# Patient Record
Sex: Male | Born: 1995 | Race: White | Hispanic: No | Marital: Single | Smoking: Never smoker
Health system: Southern US, Community
[De-identification: ages and names within clinical notes are randomized; demographics above are authoritative.]

## PROBLEM LIST (undated history)

## (undated) DIAGNOSIS — R222 Localized swelling, mass and lump, trunk: Secondary | ICD-10-CM

## (undated) HISTORY — PX: NO PAST SURGERIES: SHX2092

---
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
## 2013-07-23 ENCOUNTER — Ambulatory Visit (INDEPENDENT_AMBULATORY_CARE_PROVIDER_SITE_OTHER): Payer: PRIVATE HEALTH INSURANCE | Admitting: Physician Assistant

## 2013-07-23 ENCOUNTER — Encounter: Payer: Self-pay | Admitting: Physician Assistant

## 2013-07-23 VITALS — BP 126/73 | HR 63 | Temp 98.6°F | Ht 71.0 in | Wt 171.0 lb

## 2013-07-23 DIAGNOSIS — L723 Sebaceous cyst: Secondary | ICD-10-CM

## 2013-07-23 NOTE — Progress Notes (Signed)
Subjective:     Patient ID: Devin Ware, male   DOB: Nov 07, 1996, 17 y.o.   MRN: [id number]  HPI Pt with a cyst to the midline L-spine States it has been there for several months Denies any pain or drainage from the site Area has been constant size Also sates he has an area intermit to the L cerv region  Review of Systems  All other systems reviewed and are negative.       Objective:   Physical Exam Small cystic lesion to the L spine just R lateral to spine No erythema, edema, or induration No area of fluct    Assessment:     Cyst    Plan:    Cont to observe If he wishes excision recommended referral to general surg given location F/U prn

## 2013-07-23 NOTE — Patient Instructions (Signed)

## 2013-08-27 ENCOUNTER — Telehealth: Payer: Self-pay | Admitting: Nurse Practitioner

## 2013-08-27 NOTE — Telephone Encounter (Signed)
DOT should be through the [facility] [place] [facility]. Scheduled on the industrial side of the schedule. [facility] nurse aware.  Patient aware of appt day and time.

## 2013-10-17 ENCOUNTER — Encounter (INDEPENDENT_AMBULATORY_CARE_PROVIDER_SITE_OTHER): Payer: Self-pay

## 2013-10-17 ENCOUNTER — Ambulatory Visit (INDEPENDENT_AMBULATORY_CARE_PROVIDER_SITE_OTHER): Payer: PRIVATE HEALTH INSURANCE | Admitting: Family Medicine

## 2013-10-17 ENCOUNTER — Encounter: Payer: Self-pay | Admitting: Family Medicine

## 2013-10-17 ENCOUNTER — Ambulatory Visit (INDEPENDENT_AMBULATORY_CARE_PROVIDER_SITE_OTHER): Payer: PRIVATE HEALTH INSURANCE

## 2013-10-17 ENCOUNTER — Ambulatory Visit: Payer: PRIVATE HEALTH INSURANCE | Admitting: Family Medicine

## 2013-10-17 VITALS — BP 110/57 | HR 49 | Temp 98.3°F | Ht 71.02 in | Wt 170.0 lb

## 2013-10-17 DIAGNOSIS — L723 Sebaceous cyst: Secondary | ICD-10-CM

## 2013-10-17 DIAGNOSIS — R222 Localized swelling, mass and lump, trunk: Secondary | ICD-10-CM

## 2013-10-17 DIAGNOSIS — R229 Localized swelling, mass and lump, unspecified: Secondary | ICD-10-CM

## 2013-10-26 DIAGNOSIS — R222 Localized swelling, mass and lump, trunk: Secondary | ICD-10-CM

## 2013-10-26 HISTORY — DX: Localized swelling, mass and lump, trunk: R22.2

## 2013-10-30 ENCOUNTER — Ambulatory Visit (INDEPENDENT_AMBULATORY_CARE_PROVIDER_SITE_OTHER): Payer: PRIVATE HEALTH INSURANCE | Admitting: Surgery

## 2013-10-30 ENCOUNTER — Encounter (INDEPENDENT_AMBULATORY_CARE_PROVIDER_SITE_OTHER): Payer: Self-pay | Admitting: Surgery

## 2013-10-30 VITALS — BP 102/58 | HR 60 | Temp 97.8°F | Resp 14 | Ht 70.0 in | Wt 168.0 lb

## 2013-10-30 DIAGNOSIS — R222 Localized swelling, mass and lump, trunk: Secondary | ICD-10-CM

## 2013-10-30 DIAGNOSIS — R229 Localized swelling, mass and lump, unspecified: Secondary | ICD-10-CM

## 2013-10-30 NOTE — Progress Notes (Signed)
General Surgery [facility]  Chief Complaint  Patient presents with  . New Evaluation    eval mass on back - referral from Caren Macadam, PA-C, at [facility]    HISTORY: Patient is a 17 year old male referred by his primary care provider for evaluation of a soft tissue mass on the right lower back. This has been present for several months. It has gradually increased in size. It causes him discomfort when there is any pressure against this region. 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 He presents today for evaluation for surgical excision.  Patient denies any other such lesions. He has had no prior lesions removed.  History reviewed. No pertinent past medical history.  No current outpatient prescriptions on file.   No current facility-administered medications for this visit.    No Known Allergies  Family History  Problem Relation Age of Onset  . Hypertension Mother   . Hypertension Father   . Hyperlipidemia Father     History   Social History  . Marital Status: Single    Spouse Name: N/A    Number of Children: N/A  . Years of Education: N/A   Social History Main Topics  . Smoking status: Never Smoker   . Smokeless tobacco: Never Used  . Alcohol Use: No  . Drug Use: No  . Sexual Activity: None   Other Topics Concern  . None   Social History Narrative  . None    REVIEW OF SYSTEMS - PERTINENT POSITIVES ONLY: Slowly increasing mass right lower back. Pain with pressure on region. No other lesions present.  EXAM: Filed Vitals:   10/30/13 0854  BP: 102/58  Pulse: 60  Temp: 97.8 F (36.6 C)  Resp: 14    HEENT: normocephalic; pupils equal and reactive; sclerae clear; dentition good; mucous membranes moist NECK:  symmetric on extension; no palpable anterior or posterior cervical lymphadenopathy; no supraclavicular masses; no tenderness CHEST: clear to auscultation bilaterally without rales, rhonchi, or  wheezes CARDIAC: regular rate and rhythm without significant murmur; peripheral pulses are full BACK:  The skin of the back appears grossly normal; there is a palpable 1.5 cm mass over the right paraspinous muscle in the lumbar region; this mass is firm discrete mobile and mildly to moderately tender to palpation; no sign of infection EXT:  non-tender without edema; no deformity NEURO: no gross focal deficits; no sign of tremor   LABORATORY RESULTS: See [facility] (CHL-Epic) for most recent results  RADIOLOGY RESULTS: See [facility] (CHL-Epic) for most recent results  IMPRESSION: Soft tissue mass, right lumbar region, 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 I believe this can be done as an outpatient procedure under sedation. We will make arrangements for surgery in the near future.  The risks and benefits of the procedure have been discussed at length with the patient.  The patient understands the proposed procedure, potential alternative treatments, and the course of recovery to be expected.  All of the patient's questions have been answered at this time.  The patient wishes to proceed with surgery.  Velora Heckler, MD, FACS General & Endocrine Surgery [facility]  Primary Care Physician: Rudi Heap, MD

## 2013-10-30 NOTE — Patient Instructions (Signed)
  [facility] -- DISCHARGE INSTRUCTIONS  REMINDER:   Carry a list of your medications and allergies with you at all times  Call your pharmacy at least 1 week in advance to refill prescriptions  Do not mix any prescribed pain medicine with alcohol  Do not drive any motor vehicles while taking pain medication  Take medications with food unless otherwise directed  Follow-up appointments (date to return to physician): Please call [phone] to confirm your follow up appointment with your surgeon.  Call your Surgeon if you have:  Temperature greater than 101.0  Persistent nausea and vomiting  Severe uncontrolled pain  Redness, tenderness, or signs of infection (pain, swelling, redness, odor or    green/yellow discharge around the site)  Difficulty breathing, headache or visual disturbances  Hives  Persistent dizziness or light-headedness  Any other questions or concerns you may have after discharge  In an emergency, call 911 or go to an Emergency Department at a nearby hospital.  Diet: Begin with liquids, and if they are tolerated, resume your usual diet.  Avoid spicy, greasy or heavy foods.  If you have nausea or vomiting, go back to liquids.  If you cannot keep liquids down, call your doctor.  Avoid alcohol consumption while on prescription pain medications. Good nutrition promotes healing. Increase fiber and fluids.   Prabhleen Montemayor M. Callie Facey, MD, FACS [facility] Office: [phone] 

## 2013-10-30 NOTE — Progress Notes (Signed)
  Subjective:    Patient ID: MAINOR HELLMANN, male    DOB: 05/03/1996, 17 y.o.   MRN: [id number]  HPI Pt presents with chief complaint of nodule on upper back  Was diagnosed with paraspinal sebaceous cyst.  Pt states that lesion has been present for many years.  Has become painful if he puts too much pressure on his back.  Was seen for this in the past with recommendation for surgical removal given distribution if area became painful.  Pt feels that he is ready to have cyst removed.  No fever or chills.  No constant pain over affected area.     Review of Systems  All other systems reviewed and are negative.       Objective:   Physical Exam  Constitutional: He appears well-developed and well-nourished.  HENT:  Head: Normocephalic and atraumatic.  Eyes: Conjunctivae are normal. Pupils are equal, round, and reactive to light.  Neck: Normal range of motion.  Cardiovascular: Normal rate and regular rhythm.   Pulmonary/Chest: Effort normal and breath sounds normal.  Abdominal: Soft.  Musculoskeletal:       Arms: Small 1x1cm sebaceous cyst over affected Minimal to mild TTP  No peripheral erythema or drainage.   Skin: Skin is warm.   [facility] reading (PRIMARY) by  Dr. Alvester Morin  T spine xrays preliminarily negative for any acute mass, fracture, or dislocation pending formal read.                                          Assessment & Plan:  Mass on back - Plan: DG Thoracic Spine 2 View, Ambulatory referral to General Surgery  Will refer to general surgery for evaluation.  Location of lesion is not directly over the spinal column suc hthat neurosurgery referral is indicated, however this is a consideration- lesion mainly over r erector spinae muscle-fairy superficial.  Pt and family aggreeable to this  No signs of infection on exam Imaging preliminarily WNL Follow up as needed.

## 2013-11-08 ENCOUNTER — Encounter (HOSPITAL_BASED_OUTPATIENT_CLINIC_OR_DEPARTMENT_OTHER): Payer: Self-pay | Admitting: *Deleted

## 2013-11-14 ENCOUNTER — Other Ambulatory Visit (INDEPENDENT_AMBULATORY_CARE_PROVIDER_SITE_OTHER): Payer: Self-pay | Admitting: Surgery

## 2013-11-15 ENCOUNTER — Encounter (HOSPITAL_BASED_OUTPATIENT_CLINIC_OR_DEPARTMENT_OTHER)
Admission: RE | Disposition: A | Discharge status: Home / Self Care | Payer: Self-pay | Source: Ambulatory Visit | Attending: Surgery

## 2013-11-15 ENCOUNTER — Ambulatory Visit (HOSPITAL_BASED_OUTPATIENT_CLINIC_OR_DEPARTMENT_OTHER): Payer: 59 | Admitting: Certified Registered Nurse Anesthetist

## 2013-11-15 ENCOUNTER — Ambulatory Visit (HOSPITAL_BASED_OUTPATIENT_CLINIC_OR_DEPARTMENT_OTHER)
Admission: RE | Admit: 2013-11-15 | Discharge: 2013-11-15 | Disposition: A | Discharge status: Home / Self Care | Payer: 59 | Source: Ambulatory Visit | Attending: Surgery | Admitting: Surgery

## 2013-11-15 ENCOUNTER — Encounter (HOSPITAL_BASED_OUTPATIENT_CLINIC_OR_DEPARTMENT_OTHER): Payer: Self-pay | Admitting: Certified Registered Nurse Anesthetist

## 2013-11-15 ENCOUNTER — Encounter (HOSPITAL_BASED_OUTPATIENT_CLINIC_OR_DEPARTMENT_OTHER): Payer: 59 | Admitting: Certified Registered Nurse Anesthetist

## 2013-11-15 DIAGNOSIS — D216 Benign neoplasm of connective and other soft tissue of trunk, unspecified: Secondary | ICD-10-CM | POA: Insufficient documentation

## 2013-11-15 DIAGNOSIS — R222 Localized swelling, mass and lump, trunk: Secondary | ICD-10-CM

## 2013-11-15 HISTORY — DX: Localized swelling, mass and lump, trunk: R22.2

## 2013-11-15 HISTORY — PX: MASS EXCISION: SHX2000

## 2013-11-15 SURGERY — EXCISION MASS
Anesthesia: General | Site: Back | Laterality: Right | Wound class: Clean

## 2013-11-15 MED ORDER — LIDOCAINE HCL (PF) 1 % IJ SOLN
INTRAMUSCULAR | Status: AC
  Filled 2013-11-15: qty 30

## 2013-11-15 MED ORDER — METHYLPREDNISOLONE ACETATE 40 MG/ML IJ SUSP
INTRAMUSCULAR | Status: AC
  Filled 2013-11-15: qty 1

## 2013-11-15 MED ORDER — HYDROMORPHONE HCL PF 1 MG/ML IJ SOLN: 0.2500 mg | INTRAMUSCULAR | Status: DC | PRN

## 2013-11-15 MED ORDER — HYDROCODONE-ACETAMINOPHEN 5-325 MG PO TABS: 1.0000 | ORAL_TABLET | ORAL | Status: DC | PRN

## 2013-11-15 MED ORDER — BUPIVACAINE-EPINEPHRINE 0.5% -1:200000 IJ SOLN
INTRAMUSCULAR | Status: DC | PRN
  Administered 2013-11-15: 7 mL

## 2013-11-15 MED ORDER — LIDOCAINE HCL (CARDIAC) 20 MG/ML IV SOLN
INTRAVENOUS | Status: DC | PRN
  Administered 2013-11-15: 60 mg via INTRAVENOUS

## 2013-11-15 MED ORDER — BUPIVACAINE HCL (PF) 0.25 % IJ SOLN
INTRAMUSCULAR | Status: AC
  Filled 2013-11-15: qty 30

## 2013-11-15 MED ORDER — CEFAZOLIN SODIUM-DEXTROSE 2-3 GM-% IV SOLR: 2.0000 g | INTRAVENOUS | Status: DC

## 2013-11-15 MED ORDER — BUPIVACAINE HCL (PF) 0.5 % IJ SOLN
INTRAMUSCULAR | Status: AC
  Filled 2013-11-15: qty 30

## 2013-11-15 MED ORDER — MIDAZOLAM HCL 2 MG/2ML IJ SOLN: 1.0000 mg | INTRAMUSCULAR | Status: DC | PRN

## 2013-11-15 MED ORDER — LIDOCAINE HCL 2 % IJ SOLN
INTRAMUSCULAR | Status: AC
  Filled 2013-11-15: qty 20

## 2013-11-15 MED ORDER — CEFAZOLIN SODIUM-DEXTROSE 2-3 GM-% IV SOLR
INTRAVENOUS | Status: AC
  Filled 2013-11-15: qty 50

## 2013-11-15 MED ORDER — MEPERIDINE HCL 25 MG/ML IJ SOLN: 6.2500 mg | INTRAMUSCULAR | Status: DC | PRN

## 2013-11-15 MED ORDER — MIDAZOLAM HCL 2 MG/ML PO SYRP: 12.0000 mg | ORAL_SOLUTION | Freq: Once | ORAL | Status: DC | PRN

## 2013-11-15 MED ORDER — FENTANYL CITRATE 0.05 MG/ML IJ SOLN
INTRAMUSCULAR | Status: DC | PRN
  Administered 2013-11-15: 50 ug via INTRAVENOUS

## 2013-11-15 MED ORDER — FENTANYL CITRATE 0.05 MG/ML IJ SOLN
INTRAMUSCULAR | Status: AC
  Filled 2013-11-15: qty 4

## 2013-11-15 MED ORDER — MIDAZOLAM HCL 5 MG/5ML IJ SOLN
INTRAMUSCULAR | Status: DC | PRN
  Administered 2013-11-15: 2 mg via INTRAVENOUS

## 2013-11-15 MED ORDER — FENTANYL CITRATE 0.05 MG/ML IJ SOLN: 50.0000 ug | INTRAMUSCULAR | Status: DC | PRN

## 2013-11-15 MED ORDER — ONDANSETRON HCL 4 MG/2ML IJ SOLN: 4.0000 mg | Freq: Once | INTRAMUSCULAR | Status: DC | PRN

## 2013-11-15 MED ORDER — LACTATED RINGERS IV SOLN
INTRAVENOUS | Status: DC
  Administered 2013-11-15 (×2): via INTRAVENOUS

## 2013-11-15 MED ORDER — MIDAZOLAM HCL 2 MG/2ML IJ SOLN
INTRAMUSCULAR | Status: AC
  Filled 2013-11-15: qty 2

## 2013-11-15 MED ORDER — OXYCODONE HCL 5 MG/5ML PO SOLN
5.0000 mg | Freq: Once | ORAL | Status: DC | PRN
Start: 2013-11-15 — End: 2013-11-15

## 2013-11-15 MED ORDER — ONDANSETRON HCL 4 MG/2ML IJ SOLN
INTRAMUSCULAR | Status: DC | PRN
  Administered 2013-11-15: 4 mg via INTRAVENOUS

## 2013-11-15 MED ORDER — PROPOFOL INFUSION 10 MG/ML OPTIME
INTRAVENOUS | Status: DC | PRN
  Administered 2013-11-15: 75 ug/kg/min via INTRAVENOUS

## 2013-11-15 MED ORDER — OXYCODONE HCL 5 MG PO TABS: 5.0000 mg | ORAL_TABLET | Freq: Once | ORAL | Status: DC | PRN

## 2013-11-15 MED ORDER — BUPIVACAINE-EPINEPHRINE PF 0.5-1:200000 % IJ SOLN
INTRAMUSCULAR | Status: AC
  Filled 2013-11-15: qty 30

## 2013-11-15 SURGICAL SUPPLY — 37 items
BANDAGE GAUZE ELAST BULKY 4 IN (GAUZE/BANDAGES/DRESSINGS) IMPLANT
BENZOIN TINCTURE PRP APPL 2/3 (GAUZE/BANDAGES/DRESSINGS) ×2 IMPLANT
BLADE SURG 15 STRL LF DISP TIS (BLADE) ×1 IMPLANT
BLADE SURG 15 STRL SS (BLADE) ×1
BNDG COHESIVE 4X5 TAN STRL (GAUZE/BANDAGES/DRESSINGS) IMPLANT
CHLORAPREP W/TINT 26ML (MISCELLANEOUS) ×2 IMPLANT
CLEANER CAUTERY TIP 5X5 PAD (MISCELLANEOUS) IMPLANT
COVER MAYO STAND STRL (DRAPES) ×2 IMPLANT
COVER TABLE BACK 60X90 (DRAPES) ×2 IMPLANT
DECANTER SPIKE VIAL GLASS SM (MISCELLANEOUS) IMPLANT
DRAPE EXTREMITY T 121X128X90 (DRAPE) IMPLANT
DRAPE PED LAPAROTOMY (DRAPES) ×2 IMPLANT
DRAPE U-SHAPE 76X120 STRL (DRAPES) IMPLANT
DRAPE UTILITY XL STRL (DRAPES) ×2 IMPLANT
DRSG TEGADERM 4X4.75 (GAUZE/BANDAGES/DRESSINGS) IMPLANT
ELECT REM PT RETURN 9FT ADLT (ELECTROSURGICAL) ×2
ELECTRODE REM PT RTRN 9FT ADLT (ELECTROSURGICAL) ×1 IMPLANT
GAUZE SPONGE 4X4 12PLY STRL LF (GAUZE/BANDAGES/DRESSINGS) ×2 IMPLANT
GLOVE ECLIPSE 6.5 STRL STRAW (GLOVE) ×2 IMPLANT
GLOVE SURG ORTHO 8.0 STRL STRW (GLOVE) ×2 IMPLANT
GLOVE SURG SS PI 7.0 STRL IVOR (GLOVE) ×2 IMPLANT
GOWN PREVENTION PLUS XLARGE (GOWN DISPOSABLE) ×2 IMPLANT
GOWN PREVENTION PLUS XXLARGE (GOWN DISPOSABLE) ×2 IMPLANT
NEEDLE HYPO 25X1 1.5 SAFETY (NEEDLE) ×2 IMPLANT
PACK BASIN DAY SURGERY FS (CUSTOM PROCEDURE TRAY) ×2 IMPLANT
PAD CLEANER CAUTERY TIP 5X5 (MISCELLANEOUS)
PENCIL BUTTON HOLSTER BLD 10FT (ELECTRODE) ×2 IMPLANT
SHEET MEDIUM DRAPE 40X70 STRL (DRAPES) IMPLANT
STOCKINETTE 4X48 STRL (DRAPES) IMPLANT
STRIP CLOSURE SKIN 1/2X4 (GAUZE/BANDAGES/DRESSINGS) ×2 IMPLANT
SUT ETHILON 3 0 PS 1 (SUTURE) IMPLANT
SUT VIC AB 3-0 FS2 27 (SUTURE) ×2 IMPLANT
SUT VICRYL 3-0 CR8 SH (SUTURE) ×2 IMPLANT
SUT VICRYL 4-0 PS2 18IN ABS (SUTURE) IMPLANT
SYR CONTROL 10ML LL (SYRINGE) ×2 IMPLANT
TOWEL OR 17X24 6PK STRL BLUE (TOWEL DISPOSABLE) ×4 IMPLANT
TOWEL OR NON WOVEN STRL DISP B (DISPOSABLE) ×2 IMPLANT

## 2013-11-15 NOTE — Brief Op Note (Signed)
11/15/2013  9:28 AM  PATIENT:  Devin Ware  17 y.o. male  PRE-OPERATIVE DIAGNOSIS:  mass on back right lumbar region 1.5 cm   POST-OPERATIVE DIAGNOSIS:  1 cm fibrous mass right back  PROCEDURE:  Procedure(s): EXCISION MASS right lumbar paraspinous region (Right)  SURGEON:  Surgeon(s) and Role:    * Velora Heckler, MD - Primary  ANESTHESIA:   IV sedation  EBL:     BLOOD ADMINISTERED:none  DRAINS: none   LOCAL MEDICATIONS USED:  MARCAINE     SPECIMEN:  Excision  DISPOSITION OF SPECIMEN:  PATHOLOGY  COUNTS:  YES  TOURNIQUET:  * No tourniquets in log *  DICTATION: .Other Dictation: Dictation Number [id number]  PLAN OF CARE: Discharge to home after PACU  PATIENT DISPOSITION:  PACU - hemodynamically stable.   Delay start of Pharmacological VTE agent (>24hrs) due to surgical blood loss or risk of bleeding: yes  Velora Heckler, MD, [facility] Office: [phone]

## 2013-11-15 NOTE — Transfer of Care (Signed)
Immediate Anesthesia Transfer of Care Note  Patient: Devin Ware  Procedure(s) Performed: Procedure(s): EXCISION MASS right lumbar paraspinous region (Right)  Patient Location: PACU  Anesthesia Type:MAC  Level of Consciousness: awake, alert , oriented and patient cooperative  Airway & Oxygen Therapy: Patient Spontanous Breathing and Patient connected to face mask oxygen  Post-op Assessment: Report given to PACU RN and Post -op Vital signs reviewed and stable  Post vital signs: Reviewed and stable  Complications: No apparent anesthesia complications

## 2013-11-15 NOTE — Anesthesia Preprocedure Evaluation (Signed)

## 2013-11-15 NOTE — H&P (View-Only) (Signed)
General Surgery - [facility]  Chief Complaint  Patient presents with  . New Evaluation    eval mass on back - referral from William Webster, PA-C, at [facility]    HISTORY: Patient is a 17-year-old male referred by his primary care provider for evaluation of a soft tissue mass on the right lower back. This has been present for several months. It has gradually increased in size. It causes him discomfort when there is any pressure against this region. 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 He presents today for evaluation for surgical excision.  Patient denies any other such lesions. He has had no prior lesions removed.  History reviewed. No pertinent past medical history.  No current outpatient prescriptions on file.   No current facility-administered medications for this visit.    No Known Allergies  Family History  Problem Relation Age of Onset  . Hypertension Mother   . Hypertension Father   . Hyperlipidemia Father     History   Social History  . Marital Status: Single    Spouse Name: N/A    Number of Children: N/A  . Years of Education: N/A   Social History Main Topics  . Smoking status: Never Smoker   . Smokeless tobacco: Never Used  . Alcohol Use: No  . Drug Use: No  . Sexual Activity: None   Other Topics Concern  . None   Social History Narrative  . None    REVIEW OF SYSTEMS - PERTINENT POSITIVES ONLY: Slowly increasing mass right lower back. Pain with pressure on region. No other lesions present.  EXAM: Filed Vitals:   10/30/13 0854  BP: 102/58  Pulse: 60  Temp: 97.8 F (36.6 C)  Resp: 14    HEENT: normocephalic; pupils equal and reactive; sclerae clear; dentition good; mucous membranes moist NECK:  symmetric on extension; no palpable anterior or posterior cervical lymphadenopathy; no supraclavicular masses; no tenderness CHEST: clear to auscultation bilaterally without rales, rhonchi, or  wheezes CARDIAC: regular rate and rhythm without significant murmur; peripheral pulses are full BACK:  The skin of the back appears grossly normal; there is a palpable 1.5 cm mass over the right paraspinous muscle in the lumbar region; this mass is firm discrete mobile and mildly to moderately tender to palpation; no sign of infection EXT:  non-tender without edema; no deformity NEURO: no gross focal deficits; no sign of tremor   LABORATORY RESULTS: See [facility] (CHL-Epic) for most recent results  RADIOLOGY RESULTS: See [facility] (CHL-Epic) for most recent results  IMPRESSION: Soft tissue mass, right lumbar region, 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 I believe this can be done as an outpatient procedure under sedation. We will make arrangements for surgery in the near future.  The risks and benefits of the procedure have been discussed at length with the patient.  The patient understands the proposed procedure, potential alternative treatments, and the course of recovery to be expected.  All of the patient's questions have been answered at this time.  The patient wishes to proceed with surgery.  Rea Kalama M. Ahriana Gunkel, MD, FACS General & Endocrine Surgery [facility]  Primary Care Physician: MOORE, DONALD, MD   

## 2013-11-15 NOTE — Anesthesia Procedure Notes (Signed)
Procedure Name: MAC Date/Time: 11/15/2013 9:04 AM Performed by: Velora Heckler Pre-anesthesia Checklist: Patient identified, Emergency Drugs available, Suction available, Patient being monitored and Timeout performed Patient Re-evaluated:Patient Re-evaluated prior to inductionOxygen Delivery Method: Simple face mask

## 2013-11-15 NOTE — Interval H&P Note (Signed)
History and Physical Interval Note:  11/15/2013 8:45 AM  Devin Ware  has presented today for surgery, with the diagnosis of mass on back right lumbar region 1.5 cm   The various methods of treatment have been discussed with the patient and family. After consideration of risks, benefits and other options for treatment, the patient has consented to  Procedure(s): EXCISION MASS right lumbar paraspinous region (Right) as a surgical intervention .  The patient's history has been reviewed, patient examined, no change in status, stable for surgery.  I have reviewed the patient's chart and labs.  Questions were answered to the patient's satisfaction.     [name] Judie Petit

## 2013-11-15 NOTE — Anesthesia Postprocedure Evaluation (Signed)
Anesthesia Post Note  Patient: Devin Ware  Procedure(s) Performed: Procedure(s) (LRB): EXCISION MASS right lumbar paraspinous region (Right)  Anesthesia type: general  Patient location: PACU  Post pain: Pain level controlled  Post assessment: Patient's Cardiovascular Status Stable  Last Vitals:  Filed Vitals:   11/15/13 1014  BP: 124/58  Pulse: 53  Temp: 36.4 C  Resp: 14    Post vital signs: Reviewed and stable  Level of consciousness: sedated  Complications: No apparent anesthesia complications

## 2013-11-18 ENCOUNTER — Encounter (HOSPITAL_BASED_OUTPATIENT_CLINIC_OR_DEPARTMENT_OTHER): Payer: Self-pay | Admitting: Surgery

## 2013-11-18 ENCOUNTER — Telehealth (INDEPENDENT_AMBULATORY_CARE_PROVIDER_SITE_OTHER): Payer: Self-pay | Admitting: *Deleted

## 2013-11-18 NOTE — Telephone Encounter (Signed)
Error

## 2013-11-18 NOTE — Telephone Encounter (Signed)
Patient called to ask if he has any restrictions.  Patient states he went ahead and went back to work today and is just using light duty restrictions however when the hospital called him they told the patient he shouldn't be back at work yet.  Explained that I would send a message to Dr. Gerrit Friends to ask him about this then we will let him know.  Patient states understanding and agreeable at this time.

## 2013-11-18 NOTE — Op Note (Signed)
NAMEFANNIE, ALOMAR NO.:  [id number]  MEDICAL RECORD NO.:  [id number]  LOCATION:                               FACILITY:  [facility]  PHYSICIAN:  Velora Heckler, MD      DATE OF BIRTH:  [birth year]  DATE OF PROCEDURE:  11/15/2013                               OPERATIVE REPORT   PREOPERATIVE DIAGNOSIS:  Mass, right lower back (1 x 1 x 1 cm), subcutaneous.  POSTOPERATIVE DIAGNOSIS:  Mass, right lower back (1 x 1 x 1 cm), subcutaneous.  PROCEDURE:  Excision of subcutaneous mass, right lower back.  SURGEON:  Velora Heckler, MD, FACS  ANESTHESIA:  Local with intravenous sedation.  PREPARATION:  ChloraPrep.  COMPLICATIONS:  None.  INDICATIONS:  The patient is a 17 year old male referred by his primary care provider for soft tissue mass in the right lower back.  This has been present for several months and has gradually increased in size.  It causes discomfort with prolonged sitting and with physical activity.  He desires surgical excision for management of symptoms and definitive diagnosis.  BODY OF REPORT:  Procedure was done in OR #8 at the [facility].  The patient was brought to the operating room, placed in a prone position on the operating room table.  Following administration of intravenous sedation, the patient was prepped and draped in the usual aseptic fashion.  After ascertaining that an adequate level of sedation had been achieved, the skin was anesthetized with local anesthetic.  An elliptical incision was made over the mass.  Dissection was carried full- thickness through the skin and into the subcutaneous tissues.  Mass appears to be fibrous.  It measured approximately 1 cm in size.  It was excised with a margin of adipose tissue circumferentially.  Hemostasis was achieved with the electrocautery.  The entire lesion was submitted to Pathology for review.  Skin edges were reapproximated with interrupted 3-0 Vicryl  subcuticular sutures.  Wound was washed and dried and benzoin and Steri-Strips were applied.  Sterile dressings were applied.  The patient was awakened from anesthesia and brought to the recovery room.  The patient tolerated the procedure well.   Velora Heckler, MD, [facility] Office: [phone]    TMG/MEDQ  D:  11/15/2013  T:  11/16/2013  Job:  [id number]  cc:   Caren Macadam, PA Ernestina Penna, M.D.

## 2013-11-19 NOTE — Progress Notes (Signed)
Quick Note:  Please contact patient and notify of benign pathology results.  Aviyon Hocevar M. Lexxie Winberg, MD, FACS [facility] Office: [phone]   ______ 

## 2013-11-19 NOTE — Telephone Encounter (Signed)
Called and spoke with patient at this time.  Gave patient below message.  Patient states understanding and agreeable.

## 2013-11-19 NOTE — Telephone Encounter (Signed)
OK for patient to return to work with light duty restrictions for 10 days.  Thanks,  Velora Heckler, MD, [facility] Office: [phone]

## 2013-11-20 ENCOUNTER — Telehealth (INDEPENDENT_AMBULATORY_CARE_PROVIDER_SITE_OTHER): Payer: Self-pay

## 2013-11-20 NOTE — Telephone Encounter (Signed)
Pt home doing well. PO appt made. Pt given path result.

## 2013-11-26 ENCOUNTER — Encounter (INDEPENDENT_AMBULATORY_CARE_PROVIDER_SITE_OTHER): Payer: PRIVATE HEALTH INSURANCE

## 2013-12-11 ENCOUNTER — Encounter (INDEPENDENT_AMBULATORY_CARE_PROVIDER_SITE_OTHER): Payer: Self-pay | Admitting: Surgery

## 2013-12-11 ENCOUNTER — Ambulatory Visit (INDEPENDENT_AMBULATORY_CARE_PROVIDER_SITE_OTHER): Payer: PRIVATE HEALTH INSURANCE | Admitting: Surgery

## 2013-12-11 VITALS — BP 110/76 | HR 60 | Temp 98.4°F | Resp 14 | Ht 70.0 in | Wt 173.8 lb

## 2013-12-11 DIAGNOSIS — R229 Localized swelling, mass and lump, unspecified: Secondary | ICD-10-CM

## 2013-12-11 DIAGNOSIS — R222 Localized swelling, mass and lump, trunk: Secondary | ICD-10-CM

## 2013-12-11 NOTE — Progress Notes (Signed)
General Surgery [facility]  Chief Complaint  Patient presents with  . 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 Unfortunately, the wound has opened and there is a dry eschar present. Patient denies any drainage.  EXAM: Surgical wound in the mid back has opened and there is a dry eschar measuring 2 cm x 5 mm in size. There is no cellulitis. There is no drainage. There is no fluctuance.  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 He will covered with a dry dressing. This should heal uneventfully.  Patient will return for surgical care as needed.  Velora Heckler, MD, FACS General & Endocrine Surgery [facility]   Visit Diagnoses: 1. Mass on back, right lumbar region

## 2013-12-11 NOTE — Patient Instructions (Signed)
Apply triple antibiotic ointment to wound 2-3 times daily. Covered with large Band-Aid.  Cleanse wound in shower with soap and water daily.  Velora Heckler, MD, [facility] Office: [phone]

## 2014-05-15 ENCOUNTER — Encounter: Payer: Self-pay | Admitting: Physician Assistant

## 2014-05-15 ENCOUNTER — Ambulatory Visit (INDEPENDENT_AMBULATORY_CARE_PROVIDER_SITE_OTHER): Payer: PRIVATE HEALTH INSURANCE | Admitting: Physician Assistant

## 2014-05-15 VITALS — BP 119/69 | HR 48 | Temp 98.1°F | Ht 70.0 in | Wt 177.0 lb

## 2014-05-15 DIAGNOSIS — M542 Cervicalgia: Secondary | ICD-10-CM

## 2014-05-15 NOTE — Progress Notes (Signed)
Subjective:     Patient ID: Devin Ware, male   DOB: July 01, 1996, 18 y.o.   MRN: [id number]  HPI Pt with intermit pain to the L side of the neck for 1 yr States he was told it may be an inflamed lymph node He was not given any med and told to f/u Sx improved so never did f/u Now with return of pain Thinks he feels a knot Some radiation to the L shoulder  Review of Systems Denies any numbness to the upper ext No pain to the mouth, jaw , or ear No S/T Denies weight gain or loss No fever, chills, or night sweats    Objective:   Physical Exam NAD L ear- canal and TM nl No ant or post cerv node + TTP along distal sternocleidomastoid muscle No definite mass, cyst, or node palp. FROM of the C-spine- increase in sx with rotation R Shoulder shrug equal      Assessment:     Neck pain    Plan:     Given duration will set up for CT of neck to r/o mass Pt to f/u after scan

## 2014-05-15 NOTE — Patient Instructions (Signed)
Cervical Sprain A cervical sprain is an injury in the neck in which the strong, fibrous tissues (ligaments) that connect your neck bones stretch or tear. Cervical sprains can range from mild to severe. Severe cervical sprains can cause the neck vertebrae to be unstable. This can lead to damage of the spinal cord and can result in serious nervous system problems. The amount of time it takes for a cervical sprain to get better depends on the cause and extent of the injury. Most cervical sprains heal in 1 to 3 weeks. CAUSES  Severe cervical sprains may be caused by:   Contact sport injuries (such as from football, rugby, wrestling, hockey, auto racing, gymnastics, diving, martial arts, or boxing).   Motor vehicle collisions.   Whiplash injuries. This is an injury from a sudden forward-and backward whipping movement of the head and neck.  Falls.  Mild cervical sprains may be caused by:   Being in an awkward position, such as while cradling a telephone between your ear and shoulder.   Sitting in a chair that does not offer proper support.   Working at a poorly designed computer station.   Looking up or down for long periods of time.  SYMPTOMS   Pain, soreness, stiffness, or a burning sensation in the front, back, or sides of the neck. This discomfort may develop immediately after the injury or slowly, 24 hours or more after the injury.   Pain or tenderness directly in the middle of the back of the neck.   Shoulder or upper back pain.   Limited ability to move the neck.   Headache.   Dizziness.   Weakness, numbness, or tingling in the hands or arms.   Muscle spasms.   Difficulty swallowing or chewing.   Tenderness and swelling of the neck.  DIAGNOSIS  Most of the time your health care provider can diagnose a cervical sprain by taking your history and doing a physical exam. Your health care provider will ask about previous neck injuries and any known neck  problems, such as arthritis in the neck. X-rays may be taken to find out if there are any other problems, such as with the bones of the neck. Other tests, such as a CT scan or MRI, may also be needed.  TREATMENT  Treatment depends on the severity of the cervical sprain. Mild sprains can be treated with rest, keeping the neck in place (immobilization), and pain medicines. Severe cervical sprains are immediately immobilized. Further treatment is done to help with pain, muscle spasms, and other symptoms and may include:  Medicines, such as pain relievers, numbing medicines, or muscle relaxants.   Physical therapy. This may involve stretching exercises, strengthening exercises, and posture training. Exercises and improved posture can help stabilize the neck, strengthen muscles, and help stop symptoms from returning.  HOME CARE INSTRUCTIONS   Put ice on the injured area.   Put ice in a plastic bag.   Place a towel between your skin and the bag.   Leave the ice on for 15 20 minutes, 3 4 times a day.   If your injury was severe, you may have been given a cervical collar to wear. A cervical collar is a two-piece collar designed to keep your neck from moving while it heals.  Do not remove the collar unless instructed by your health care provider.  If you have long hair, keep it outside of the collar.  Ask your health care provider before making any adjustments to your collar.   Minor adjustments may be required over time to improve comfort and reduce pressure on your chin or on the back of your head.  Ifyou are allowed to remove the collar for cleaning or bathing, follow your health care provider's instructions on how to do so safely.  Keep your collar clean by wiping it with mild soap and water and drying it completely. If the collar you have been given includes removable pads, remove them every 1 2 days and hand wash them with soap and water. Allow them to air dry. They should be completely  dry before you wear them in the collar.  If you are allowed to remove the collar for cleaning and bathing, wash and dry the skin of your neck. Check your skin for irritation or sores. If you see any, tell your health care provider.  Do not drive while wearing the collar.   Only take over-the-counter or prescription medicines for pain, discomfort, or fever as directed by your health care provider.   Keep all follow-up appointments as directed by your health care provider.   Keep all physical therapy appointments as directed by your health care provider.   Make any needed adjustments to your workstation to promote good posture.   Avoid positions and activities that make your symptoms worse.   Warm up and stretch before being active to help prevent problems.  SEEK MEDICAL CARE IF:   Your pain is not controlled with medicine.   You are unable to decrease your pain medicine over time as planned.   Your activity level is not improving as expected.  SEEK IMMEDIATE MEDICAL CARE IF:   You develop any bleeding.  You develop stomach upset.  You have signs of an allergic reaction to your medicine.   Your symptoms get worse.   You develop new, unexplained symptoms.   You have numbness, tingling, weakness, or paralysis in any part of your body.  MAKE SURE YOU:   Understand these instructions.  Will watch your condition.  Will get help right away if you are not doing well or get worse. Document Released: 10/09/2007 Document Revised: 10/02/2013 Document Reviewed: 06/19/2013 [employer] Patient Information 2014 [employer].  

## 2014-05-16 ENCOUNTER — Other Ambulatory Visit: Payer: Self-pay

## 2014-05-16 DIAGNOSIS — R221 Localized swelling, mass and lump, neck: Secondary | ICD-10-CM

## 2014-05-27 ENCOUNTER — Other Ambulatory Visit: Payer: Self-pay | Admitting: *Deleted

## 2014-05-27 DIAGNOSIS — R221 Localized swelling, mass and lump, neck: Secondary | ICD-10-CM

## 2014-05-27 NOTE — Progress Notes (Signed)
CT scan to eval tender neck mass was denied. Ultrasound suggested. Ultrasound ordered.

## 2014-05-28 ENCOUNTER — Other Ambulatory Visit: Payer: Self-pay | Admitting: *Deleted

## 2014-05-28 DIAGNOSIS — R221 Localized swelling, mass and lump, neck: Secondary | ICD-10-CM

## 2014-05-29 ENCOUNTER — Other Ambulatory Visit (HOSPITAL_COMMUNITY): Payer: PRIVATE HEALTH INSURANCE

## 2014-05-29 ENCOUNTER — Ambulatory Visit (HOSPITAL_COMMUNITY): Admission: RE | Admit: 2014-05-29 | Payer: PRIVATE HEALTH INSURANCE | Source: Ambulatory Visit

## 2014-06-02 ENCOUNTER — Ambulatory Visit (HOSPITAL_COMMUNITY): Payer: PRIVATE HEALTH INSURANCE

## 2014-06-05 ENCOUNTER — Ambulatory Visit (HOSPITAL_COMMUNITY): Payer: PRIVATE HEALTH INSURANCE

## 2014-08-06 ENCOUNTER — Ambulatory Visit (HOSPITAL_COMMUNITY): Payer: PRIVATE HEALTH INSURANCE

## 2014-08-12 ENCOUNTER — Ambulatory Visit (HOSPITAL_COMMUNITY)
Admission: RE | Admit: 2014-08-12 | Discharge: 2014-08-12 | Disposition: A | Discharge status: Home / Self Care | Payer: PRIVATE HEALTH INSURANCE | Source: Ambulatory Visit | Attending: Family Medicine | Admitting: Family Medicine

## 2014-08-12 DIAGNOSIS — R221 Localized swelling, mass and lump, neck: Secondary | ICD-10-CM

## 2014-08-12 DIAGNOSIS — R220 Localized swelling, mass and lump, head: Secondary | ICD-10-CM | POA: Insufficient documentation

## 2014-08-15 ENCOUNTER — Ambulatory Visit (INDEPENDENT_AMBULATORY_CARE_PROVIDER_SITE_OTHER): Payer: PRIVATE HEALTH INSURANCE

## 2014-08-15 ENCOUNTER — Other Ambulatory Visit: Payer: Self-pay | Admitting: *Deleted

## 2014-08-15 ENCOUNTER — Other Ambulatory Visit (INDEPENDENT_AMBULATORY_CARE_PROVIDER_SITE_OTHER): Payer: PRIVATE HEALTH INSURANCE

## 2014-08-15 DIAGNOSIS — R599 Enlarged lymph nodes, unspecified: Secondary | ICD-10-CM

## 2014-08-15 LAB — POCT CBC
Granulocyte percent: 54.5 %G (ref 37–80)
HCT, POC: 48.5 % (ref 43.5–53.7)
Hemoglobin: 15.9 g/dL (ref 14.1–18.1)
LYMPH, POC: 2.3 (ref 0.6–3.4)
MCH: 28.6 pg (ref 27–31.2)
MCHC: 32.9 g/dL (ref 31.8–35.4)
MCV: 87 fL (ref 80–97)
MPV: 7.4 fL (ref 0–99.8)
PLATELET COUNT, POC: 249 10*3/uL (ref 142–424)
POC Granulocyte: 3.4 (ref 2–6.9)
POC LYMPH PERCENT: 36.9 %L (ref 10–50)
RBC: 5.6 M/uL (ref 4.69–6.13)
RDW, POC: 11.9 %
WBC: 6.3 10*3/uL (ref 4.6–10.2)

## 2014-08-15 NOTE — Addendum Note (Signed)
Addended by: Thana Ates on: 08/15/2014 12:10 PM   Modules accepted: Orders

## 2014-08-16 LAB — CMP14+EGFR
ALBUMIN: 4.7 g/dL (ref 3.5–5.5)
ALT: 23 IU/L (ref 0–44)
AST: 15 IU/L (ref 0–40)
Albumin/Globulin Ratio: 2.4 (ref 1.1–2.5)
Alkaline Phosphatase: 97 IU/L (ref 39–117)
BILIRUBIN TOTAL: 0.6 mg/dL (ref 0.0–1.2)
BUN / CREAT RATIO: 12 (ref 8–19)
BUN: 10 mg/dL (ref 6–20)
CO2: 25 mmol/L (ref 18–29)
Calcium: 10 mg/dL (ref 8.7–10.2)
Chloride: 100 mmol/L (ref 97–108)
Creatinine, Ser: 0.84 mg/dL (ref 0.76–1.27)
GFR calc non Af Amer: 126 mL/min/{1.73_m2} (ref >59)
GFR, EST AFRICAN AMERICAN: 146 mL/min/{1.73_m2} (ref >59)
Globulin, Total: 2 g/dL (ref 1.5–4.5)
Glucose: 73 mg/dL (ref 65–99)
Potassium: 4 mmol/L (ref 3.5–5.2)
Sodium: 141 mmol/L (ref 134–144)
Total Protein: 6.7 g/dL (ref 6.0–8.5)

## 2014-08-16 LAB — TSH: TSH: 1.25 u[IU]/mL (ref 0.450–4.500)

## 2015-08-14 ENCOUNTER — Ambulatory Visit (INDEPENDENT_AMBULATORY_CARE_PROVIDER_SITE_OTHER): Payer: PRIVATE HEALTH INSURANCE | Admitting: Psychology

## 2015-08-14 DIAGNOSIS — F42 Obsessive-compulsive disorder: Secondary | ICD-10-CM

## 2015-08-28 ENCOUNTER — Ambulatory Visit: Payer: PRIVATE HEALTH INSURANCE | Admitting: Psychology

## 2015-09-03 ENCOUNTER — Ambulatory Visit (INDEPENDENT_AMBULATORY_CARE_PROVIDER_SITE_OTHER): Payer: PRIVATE HEALTH INSURANCE | Admitting: Family Medicine

## 2015-09-03 ENCOUNTER — Encounter: Payer: Self-pay | Admitting: Family Medicine

## 2015-09-03 VITALS — BP 130/82 | HR 52 | Temp 96.8°F | Ht 70.0 in | Wt 185.0 lb

## 2015-09-03 DIAGNOSIS — Z0000 Encounter for general adult medical examination without abnormal findings: Secondary | ICD-10-CM | POA: Diagnosis not present

## 2015-09-03 NOTE — Patient Instructions (Signed)
Great to meet you!  Come back in 1 year unless you need [place] sooner.   Try to build good habits - Eat vegetables at every meal but breakfast and avoid fast food - Try to do something you can call exercise at least 3-4 times per week.

## 2015-09-03 NOTE — Progress Notes (Signed)
   HPI  Patient presents today for his annual physical exam  He feels well overall and states is in good health.  He works for [employer]  He denies drug use, alcohol use and and tobacco use. He is not sexually active.  PMH: Smoking status noted Past medical, surgical, family, and social history reviewed and updated in relevant portions of EMR ROS: Per HPI, otherwise negative  Objective: BP 130/82 mmHg  Pulse 52  Temp(Src) 96.8 F (36 C) (Oral)  Ht 5\' 10"  (1.778 m)  Wt 185 lb (83.915 kg)  BMI 26.54 kg/m2 Gen: NAD, alert, cooperative with exam HEENT: NCAT, EOMI, TMs daily and LDL, nares clear, oropharynx clear Neck: Trachea midline no tender lymphadenopathy CV: RRR, good S1/S2, no murmur Resp: CTABL, no wheezes, non-labored Abd: SNTND, BS present, no guarding or organomegaly Ext: No edema, warm Neuro: Alert and oriented, No gross deficits, 2+ patellar tendon reflexes  Assessment and plan:  # annual  physical exam Doing well, no labs necessary Discussed positive life choices, f/u 1 year   Laroy Apple, MD [facility] 09/03/2015, 10:48 AM

## 2015-09-08 ENCOUNTER — Ambulatory Visit (INDEPENDENT_AMBULATORY_CARE_PROVIDER_SITE_OTHER): Payer: Self-pay | Admitting: Family Medicine

## 2015-09-08 VITALS — BP 124/71 | HR 64 | Temp 98.1°F | Ht 70.0 in | Wt 188.0 lb

## 2015-09-08 DIAGNOSIS — Z024 Encounter for examination for driving license: Secondary | ICD-10-CM

## 2015-09-08 DIAGNOSIS — Z139 Encounter for screening, unspecified: Secondary | ICD-10-CM

## 2015-09-08 LAB — POCT URINALYSIS DIPSTICK
Bilirubin, UA: NEGATIVE
Blood, UA: NEGATIVE
Glucose, UA: NEGATIVE
Ketones, UA: NEGATIVE
Leukocytes, UA: NEGATIVE
NITRITE UA: NEGATIVE
Protein, UA: NEGATIVE
SPEC GRAV UA: 1.01
UROBILINOGEN UA: NEGATIVE
pH, UA: 7

## 2015-09-09 ENCOUNTER — Encounter: Payer: Self-pay | Admitting: Family Medicine

## 2015-09-09 NOTE — Progress Notes (Signed)
Subjective:  Patient ID: Devin Ware, male    DOB: 07-07-1996  Age: 19 y.o. MRN: [id number]  CC: DOT PE   HPI JAVAUN DIMPERIO presents for  DOT Exam  History [name] has a past medical history of Mass on back (10/2013).   He has past surgical history that includes No past surgeries and Mass excision (Right, 11/15/2013).   His family history includes CAD in an other family member; CVA in an other family member; Diabetes Mellitus II in an other family member; Hyperlipidemia in his father; Hypertension in his father and mother.He reports that he has never smoked. He has never used smokeless tobacco. He reports that he does not drink alcohol or use illicit drugs.  No outpatient prescriptions prior to visit.   No facility-administered medications prior to visit.    ROS Review of Systems  Constitutional: Negative for fever, chills, diaphoresis, activity change, appetite change, fatigue and unexpected weight change.  HENT: Negative for congestion, ear pain, hearing loss, postnasal drip, rhinorrhea, sore throat, tinnitus and trouble swallowing.   Eyes: Negative for photophobia, pain, discharge and redness.  Respiratory: Negative for apnea, cough, choking, chest tightness, shortness of breath, wheezing and stridor.   Cardiovascular: Negative for chest pain, palpitations and leg swelling.  Gastrointestinal: Negative for nausea, vomiting, abdominal pain, diarrhea, constipation, blood in stool and abdominal distention.  Endocrine: Negative for cold intolerance, heat intolerance, polydipsia, polyphagia and polyuria.  Genitourinary: Negative for dysuria, urgency, frequency, hematuria, flank pain, enuresis, difficulty urinating and genital sores.  Musculoskeletal: Negative for joint swelling and arthralgias.  Skin: Negative for color change, rash and wound.  Allergic/Immunologic: Negative for immunocompromised state.  Neurological: Negative for dizziness, tremors, seizures, syncope, facial  asymmetry, speech difficulty, weakness, light-headedness, numbness and headaches.  Hematological: Does not bruise/bleed easily.  Psychiatric/Behavioral: Negative for suicidal ideas, hallucinations, behavioral problems, confusion, sleep disturbance, dysphoric mood, decreased concentration and agitation. The patient is not nervous/anxious and is not hyperactive.     Objective:  BP 124/71 mmHg  Pulse 64  Temp(Src) 98.1 F (36.7 C) (Oral)  Ht 5\' 10"  (1.778 m)  Wt 188 lb (85.276 kg)  BMI 26.98 kg/m2  BP Readings from Last 3 Encounters:  09/08/15 124/71  09/03/15 130/82  05/15/14 119/69    Wt Readings from Last 3 Encounters:  09/08/15 188 lb (85.276 kg)  09/03/15 185 lb (83.915 kg)  05/15/14 177 lb (80.287 kg)     Physical Exam  Constitutional: He is oriented to person, place, and time. He appears well-developed and well-nourished. No distress.  HENT:  Head: Normocephalic and atraumatic.  Right Ear: External ear normal.  Left Ear: External ear normal.  Nose: Nose normal.  Mouth/Throat: Oropharynx is clear and moist.  Eyes: Conjunctivae and EOM are normal. Pupils are equal, round, and reactive to light.  Neck: Normal range of motion. Neck supple. No thyromegaly present.  Cardiovascular: Normal rate, regular rhythm and normal heart sounds.   No murmur heard. Pulmonary/Chest: Effort normal and breath sounds normal. No respiratory distress. He has no wheezes. He has no rales.  Abdominal: Soft. Bowel sounds are normal. He exhibits no distension. There is no tenderness.  Lymphadenopathy:    He has no cervical adenopathy.  Neurological: He is alert and oriented to person, place, and time. He has normal reflexes.  Skin: Skin is warm and dry.  Psychiatric: He has a normal mood and affect. His behavior is normal. Judgment and thought content normal.    No results found for: HGBA1C  Lab Results  Component Value Date   WBC 6.3 08/15/2014   HGB 15.9 08/15/2014   HCT 48.5 08/15/2014    GLUCOSE 73 08/15/2014   ALT 23 08/15/2014   AST 15 08/15/2014   NA 141 08/15/2014   K 4.0 08/15/2014   CL 100 08/15/2014   CREATININE 0.84 08/15/2014   BUN 10 08/15/2014   CO2 25 08/15/2014   TSH 1.250 08/15/2014    US Soft Tissue Head/neck  08/12/2014   CLINICAL DATA:  Palpable left posterior neck mass  EXAM: ULTRASOUND OF HEAD/NECK SOFT TISSUES  TECHNIQUE: Ultrasound examination of the head and neck soft tissues was performed in the area of clinical concern.  COMPARISON:  None.  FINDINGS: The palpable abnormality corresponds to a 1.7 x 0.4 x 0.8 cm lymph node.  IMPRESSION: The palpable abnormality corresponds to a small lymph node.   Electronically Signed   By: Maryclare Bean M.D.   On: 08/12/2014 08:51    Assessment & Plan:   Anias was seen today for dot pe.  Diagnoses and all orders for this visit:  Screening -     POCT urinalysis dipstick  Encounter for Department of Transportation (DOT) examination for driving license renewal   Mr. Mccarey does not currently have medications on file.  No orders of the defined types were placed in this encounter.     Follow-up: Return in about 2 years (around 09/07/2017).  Claretta Fraise, M.D.

## 2015-09-10 ENCOUNTER — Ambulatory Visit (INDEPENDENT_AMBULATORY_CARE_PROVIDER_SITE_OTHER): Payer: PRIVATE HEALTH INSURANCE | Admitting: Psychology

## 2015-09-10 DIAGNOSIS — F4542 Pain disorder with related psychological factors: Secondary | ICD-10-CM

## 2015-10-02 ENCOUNTER — Ambulatory Visit: Payer: PRIVATE HEALTH INSURANCE | Admitting: Psychology

## 2015-10-28 ENCOUNTER — Ambulatory Visit: Payer: PRIVATE HEALTH INSURANCE | Admitting: Psychology

## 2015-11-06 ENCOUNTER — Ambulatory Visit (INDEPENDENT_AMBULATORY_CARE_PROVIDER_SITE_OTHER): Payer: PRIVATE HEALTH INSURANCE | Admitting: Psychology

## 2015-11-06 DIAGNOSIS — F422 Mixed obsessional thoughts and acts: Secondary | ICD-10-CM

## 2015-11-27 ENCOUNTER — Ambulatory Visit: Payer: PRIVATE HEALTH INSURANCE | Admitting: Psychology

## 2015-12-10 ENCOUNTER — Ambulatory Visit (INDEPENDENT_AMBULATORY_CARE_PROVIDER_SITE_OTHER): Payer: PRIVATE HEALTH INSURANCE | Admitting: Psychology

## 2015-12-10 DIAGNOSIS — F422 Mixed obsessional thoughts and acts: Secondary | ICD-10-CM | POA: Diagnosis not present

## 2016-01-08 ENCOUNTER — Ambulatory Visit (INDEPENDENT_AMBULATORY_CARE_PROVIDER_SITE_OTHER): Payer: PRIVATE HEALTH INSURANCE | Admitting: Psychology

## 2016-01-08 DIAGNOSIS — F422 Mixed obsessional thoughts and acts: Secondary | ICD-10-CM

## 2016-02-19 ENCOUNTER — Ambulatory Visit (INDEPENDENT_AMBULATORY_CARE_PROVIDER_SITE_OTHER): Payer: PRIVATE HEALTH INSURANCE | Admitting: Psychology

## 2016-02-19 DIAGNOSIS — F422 Mixed obsessional thoughts and acts: Secondary | ICD-10-CM | POA: Diagnosis not present

## 2016-04-15 ENCOUNTER — Ambulatory Visit: Payer: PRIVATE HEALTH INSURANCE | Admitting: Psychology

## 2016-04-20 ENCOUNTER — Encounter (INDEPENDENT_AMBULATORY_CARE_PROVIDER_SITE_OTHER): Payer: Self-pay

## 2016-04-20 ENCOUNTER — Encounter: Payer: Self-pay | Admitting: Family Medicine

## 2016-04-20 ENCOUNTER — Ambulatory Visit (INDEPENDENT_AMBULATORY_CARE_PROVIDER_SITE_OTHER): Payer: PRIVATE HEALTH INSURANCE | Admitting: Family Medicine

## 2016-04-20 VITALS — BP 126/67 | HR 78 | Temp 97.6°F | Ht 70.0 in | Wt 197.4 lb

## 2016-04-20 DIAGNOSIS — F411 Generalized anxiety disorder: Secondary | ICD-10-CM

## 2016-04-20 MED ORDER — DULOXETINE HCL 30 MG PO CPEP: 30.0000 mg | ORAL_CAPSULE | Freq: Every day | ORAL | Status: DC

## 2016-04-20 NOTE — Progress Notes (Signed)
Subjective:  Patient ID: Devin Ware, male    DOB: [birth year]  Age: 20 y.o. MRN: [id number]  CC: GAD   HPI Devin Ware presents for Seeing therapist for GAD. Therapist recommended he see MD for medication for his anxiety.Onset several months ago. Getting worse. Treatment with counselor helpful, but not progressing adequately. Sx are causing him to  Not function well at home or work.   GAD 7 : Generalized Anxiety Score 04/20/2016  Nervous, Anxious, on Edge 3  Control/stop worrying 1  Worry too much - different things 1  Trouble relaxing 0  Restless 2  Easily annoyed or irritable 1  Afraid - awful might happen 1  Total GAD 7 Score 9  Anxiety Difficulty Not difficult at all     History Devin Ware has a past medical history of Mass on back (10/2013).   He has past surgical history that includes No past surgeries and Mass excision (Right, 11/15/2013).   His family history includes Hyperlipidemia in his father; Hypertension in his father and mother.He reports that he has never smoked. He has never used smokeless tobacco. He reports that he does not drink alcohol or use illicit drugs.    ROS Review of Systems  Constitutional: Negative for fever, chills and diaphoresis.  HENT: Negative for rhinorrhea and sore throat.   Respiratory: Negative for cough and shortness of breath.   Cardiovascular: Negative for chest pain.  Gastrointestinal: Negative for abdominal pain.  Musculoskeletal: Negative for myalgias and arthralgias.  Skin: Negative for rash.  Neurological: Negative for weakness and headaches.    Objective:  BP 126/67 mmHg  Pulse 78  Temp(Src) 97.6 F (36.4 C) (Oral)  Ht 5\' 10"  (1.778 m)  Wt 197 lb 6.4 oz (89.54 kg)  BMI 28.32 kg/m2  SpO2 100%  BP Readings from Last 3 Encounters:  04/20/16 126/67  09/08/15 124/71  09/03/15 130/82    Wt Readings from Last 3 Encounters:  04/20/16 197 lb 6.4 oz (89.54 kg)  09/08/15 188 lb (85.276 kg)  09/03/15 185 lb (83.915 kg)       Physical Exam  Constitutional: He is oriented to person, place, and time. He appears well-developed and well-nourished.  HENT:  Head: Normocephalic and atraumatic.  Right Ear: Tympanic membrane and external ear normal. No decreased hearing is noted.  Left Ear: Tympanic membrane and external ear normal. No decreased hearing is noted.  Mouth/Throat: No oropharyngeal exudate or posterior oropharyngeal erythema.  Eyes: Pupils are equal, round, and reactive to light.  Neck: Normal range of motion. Neck supple.  Cardiovascular: Normal rate and regular rhythm.   No murmur heard. Pulmonary/Chest: Breath sounds normal. No respiratory distress.  Abdominal: Soft. Bowel sounds are normal. He exhibits no mass. There is no tenderness.  Neurological: He is alert and oriented to person, place, and time.  Psychiatric: Judgment and thought content normal. His affect is blunt. His affect is not angry, not labile and not inappropriate. His speech is delayed. He is slowed and withdrawn. He is not agitated and not aggressive. Cognition and memory are normal. He exhibits a depressed mood. He is attentive.  Vitals reviewed.      Assessment & Plan:   Devin Ware was seen today for gad.  Diagnoses and all orders for this visit:  GAD (generalized anxiety disorder)  Other orders -     DULoxetine (CYMBALTA) 30 MG capsule; Take 1 capsule (30 mg total) by mouth daily. For one week then two daily. Take with a full stomach at  suppertime     I am having Devin Ware start on DULoxetine.  Meds ordered this encounter  Medications  . DULoxetine (CYMBALTA) 30 MG capsule    Sig: Take 1 capsule (30 mg total) by mouth daily. For one week then two daily. Take with a full stomach at suppertime    Dispense:  60 capsule    Refill:  0   Continue counselling. Make sure you are getting adequate rest. Regular exercise is helpful as well.  Follow-up: Return in about 2 weeks (around 05/04/2016).  Claretta Fraise, M.D.

## 2016-04-26 ENCOUNTER — Telehealth: Payer: Self-pay

## 2016-04-26 MED ORDER — DULOXETINE HCL 60 MG PO CPEP: 60.0000 mg | ORAL_CAPSULE | Freq: Every day | ORAL | Status: DC

## 2016-04-26 NOTE — Telephone Encounter (Signed)
The requested med has been sent to the pharmacy.  Please let the patient know. Thanks, WS 

## 2016-04-26 NOTE — Telephone Encounter (Signed)
Insurance will only pay for one a day    Can you change to one 60 mg capsule a day????  [facility]

## 2016-04-26 NOTE — Telephone Encounter (Signed)
Patient aware.

## 2016-06-25 ENCOUNTER — Other Ambulatory Visit: Payer: Self-pay | Admitting: Family Medicine

## 2016-06-27 ENCOUNTER — Telehealth: Payer: Self-pay | Admitting: Family Medicine

## 2016-06-27 NOTE — Telephone Encounter (Signed)
Done earlier this AM

## 2016-07-25 ENCOUNTER — Other Ambulatory Visit: Payer: Self-pay | Admitting: Family Medicine

## 2016-08-29 ENCOUNTER — Other Ambulatory Visit: Payer: Self-pay | Admitting: Family Medicine

## 2016-09-30 ENCOUNTER — Other Ambulatory Visit: Payer: Self-pay | Admitting: Family Medicine

## 2016-09-30 NOTE — Telephone Encounter (Signed)
LMOVM that will NTBS before next refill

## 2016-11-02 ENCOUNTER — Ambulatory Visit (INDEPENDENT_AMBULATORY_CARE_PROVIDER_SITE_OTHER): Payer: BC Managed Care – PPO | Admitting: Family Medicine

## 2016-11-02 ENCOUNTER — Encounter: Payer: Self-pay | Admitting: Family Medicine

## 2016-11-02 VITALS — BP 121/76 | HR 75 | Temp 97.8°F | Ht 70.0 in | Wt 238.0 lb

## 2016-11-02 DIAGNOSIS — F411 Generalized anxiety disorder: Secondary | ICD-10-CM

## 2016-11-02 MED ORDER — DULOXETINE HCL 60 MG PO CPEP: 60.0000 mg | ORAL_CAPSULE | Freq: Every day | ORAL | 5 refills | Status: DC

## 2016-11-02 NOTE — Progress Notes (Signed)
Subjective:  Devin Ware ID: Devin Ware, male    DOB: 09-02-1996  Age: 20 y.o. MRN: [id number]  CC: Medication Refill   HPI Devin Ware presents for Recheck of anxiety. 3 Of previous symptoms noted below. Of note is that Devin feeling of being nervous and on edge is now somewhat better. Devin Ware remains restless as noted below. Devin Ware has not been taking Devin full dose of Cymbalta. Devin Ware has been on 30 mg only.  GAD 7 : Generalized Anxiety Score 04/20/2016  Nervous, Anxious, on Edge 3  Control/stop worrying 1  Worry too much - different things 1  Trouble relaxing 0  Restless 2  Easily annoyed or irritable 1  Afraid - awful might happen 1  Total GAD 7 Score 9  Anxiety Difficulty Not difficult at all     History Devin Ware has a past medical history of Mass on back (10/2013).   Devin Ware has a past surgical history that includes No past surgeries and Mass excision (Right, 11/15/2013).   Devin Ware family history includes Hyperlipidemia in Devin Ware father; Hypertension in Devin Ware father and mother.Devin Ware reports that Devin Ware has never smoked. Devin Ware has never used smokeless tobacco. Devin Ware reports that Devin Ware does not drink alcohol or use drugs.    ROS Review of Systems  Constitutional: Negative for chills, diaphoresis and fever.  HENT: Negative for rhinorrhea and sore throat.   Respiratory: Negative for cough and shortness of breath.   Cardiovascular: Negative for chest pain.  Gastrointestinal: Negative for abdominal pain.  Musculoskeletal: Negative for arthralgias and myalgias.  Skin: Negative for rash.  Neurological: Negative for weakness and headaches.  Psychiatric/Behavioral: Positive for dysphoric mood. Devin Ware is nervous/anxious.     Objective:  BP 121/76   Pulse 75   Temp 97.8 F (36.6 C) (Oral)   Ht 5\' 10"  (1.778 m)   Wt 238 lb (108 kg)   BMI 34.15 kg/m   BP Readings from Last 3 Encounters:  11/02/16 121/76  04/20/16 126/67  09/08/15 124/71    Wt Readings from Last 3 Encounters:  11/02/16 238 lb (108 kg)    04/20/16 197 lb 6.4 oz (89.5 kg)  09/08/15 188 lb (85.3 kg)     Physical Exam  Constitutional: Devin Ware is oriented to person, place, and time. Devin Ware appears well-developed and well-nourished. No distress.  HENT:  Head: Normocephalic and atraumatic.  Right Ear: External ear normal.  Left Ear: External ear normal.  Nose: Nose normal.  Mouth/Throat: Oropharynx is clear and moist.  Eyes: Conjunctivae and EOM are normal. Pupils are equal, round, and reactive to light.  Neck: Normal range of motion. Neck supple. No thyromegaly present.  Cardiovascular: Normal rate, regular rhythm and normal heart sounds.   No murmur heard. Pulmonary/Chest: Effort normal and breath sounds normal. No respiratory distress. Devin Ware has no wheezes. Devin Ware has no rales.  Abdominal: Soft. Bowel sounds are normal. Devin Ware exhibits no distension. There is no tenderness.  Lymphadenopathy:    Devin Ware has no cervical adenopathy.  Neurological: Devin Ware is alert and oriented to person, place, and time. Devin Ware has normal reflexes.  Skin: Skin is warm and dry.  Psychiatric: Devin Ware has a normal mood and affect. Devin Ware behavior is normal. Judgment and thought content normal.      Assessment & Plan:   Clive was seen today for medication refill.  Diagnoses and all orders for this visit:  GAD (generalized anxiety disorder)  Other orders -     Discontinue: DULoxetine (CYMBALTA) 60 MG capsule; Take 1 capsule (60  mg total) by mouth daily.     Meds ordered this encounter  Medications  . DISCONTD: DULoxetine (CYMBALTA) 60 MG capsule    Sig: Take 1 capsule (60 mg total) by mouth daily.    Dispense:  30 capsule    Refill:  5     Follow-up: Return in about 6 months (around 05/02/2017) for Wellness.  Claretta Fraise, M.D.

## 2016-11-09 ENCOUNTER — Other Ambulatory Visit: Payer: Self-pay | Admitting: *Deleted

## 2016-11-09 MED ORDER — DULOXETINE HCL 60 MG PO CPEP: 60.0000 mg | ORAL_CAPSULE | Freq: Every day | ORAL | 5 refills | Status: DC

## 2016-12-07 ENCOUNTER — Other Ambulatory Visit: Payer: Self-pay

## 2016-12-07 ENCOUNTER — Telehealth: Payer: Self-pay

## 2016-12-07 MED ORDER — DULOXETINE HCL 60 MG PO CPEP: 60.0000 mg | ORAL_CAPSULE | Freq: Every day | ORAL | 1 refills | Status: DC

## 2016-12-07 NOTE — Telephone Encounter (Signed)
Patient states he would like his rx changed to 90 days instead of 30. Rx re sent.

## 2017-08-30 ENCOUNTER — Encounter: Payer: Self-pay | Admitting: Family Medicine

## 2017-08-30 ENCOUNTER — Ambulatory Visit (INDEPENDENT_AMBULATORY_CARE_PROVIDER_SITE_OTHER): Payer: BC Managed Care – PPO | Admitting: Family Medicine

## 2017-08-30 VITALS — BP 111/63 | HR 68 | Ht 71.0 in | Wt 232.0 lb

## 2017-08-30 DIAGNOSIS — Z024 Encounter for examination for driving license: Secondary | ICD-10-CM

## 2017-08-30 MED ORDER — DULOXETINE HCL 60 MG PO CPEP: 60.0000 mg | ORAL_CAPSULE | Freq: Every day | ORAL | 1 refills | Status: DC

## 2017-08-30 NOTE — Progress Notes (Signed)
Subjective:  Patient ID: Devin Ware, male    DOB: 1996/01/18  Age: 21 y.o. MRN: [id number]  CC: Private DOT Physical   HPI DENNARD VEZINA presents for DOT recert exam  Depression screen [facility] 2/9 08/30/2017 11/02/2016 04/20/2016  Decreased Interest 0 0 0  Down, Depressed, Hopeless 0 0 0  PHQ - 2 Score 0 0 0  Altered sleeping - - 0  Tired, decreased energy - - 0  Change in appetite - - 0  Feeling bad or failure about yourself  - - 0  Trouble concentrating - - 0  Moving slowly or fidgety/restless - - 0  Suicidal thoughts - - 0  PHQ-9 Score - - 0  Difficult doing work/chores - - Not difficult at all    History Wilburn has a past medical history of Mass on back (10/2013).   He has a past surgical history that includes No past surgeries and Mass excision (Right, 11/15/2013).   His family history includes CAD in his unknown relative; CVA in his unknown relative; Diabetes Mellitus II in his unknown relative; Hyperlipidemia in his father; Hypertension in his father and mother.He reports that he has never smoked. He has never used smokeless tobacco. He reports that he does not drink alcohol or use drugs.    ROS Review of Systems  Constitutional: Negative for chills, diaphoresis, fever and unexpected weight change.  HENT: Negative for congestion, hearing loss, rhinorrhea and sore throat.   Eyes: Negative for visual disturbance.  Respiratory: Negative for cough and shortness of breath.   Cardiovascular: Negative for chest pain.  Gastrointestinal: Negative for abdominal pain, constipation and diarrhea.  Genitourinary: Negative for dysuria and flank pain.  Musculoskeletal: Negative for arthralgias and joint swelling.  Skin: Negative for rash.  Neurological: Negative for dizziness and headaches.  Psychiatric/Behavioral: Negative for dysphoric mood and sleep disturbance. The patient is nervous/anxious (well controlled by cymbalta).     Objective:  BP 111/63   Pulse 68   Ht 5\' 11"   (1.803 m)   Wt 232 lb (105.2 kg)   BMI 32.36 kg/m   BP Readings from Last 3 Encounters:  08/30/17 111/63  11/02/16 121/76  04/20/16 126/67    Wt Readings from Last 3 Encounters:  08/30/17 232 lb (105.2 kg)  11/02/16 238 lb (108 kg)  04/20/16 197 lb 6.4 oz (89.5 kg)     Physical Exam  Constitutional: He is oriented to person, place, and time. He appears well-developed and well-nourished. No distress.  HENT:  Head: Normocephalic and atraumatic.  Right Ear: External ear normal.  Left Ear: External ear normal.  Nose: Nose normal.  Mouth/Throat: Oropharynx is clear and moist.  Eyes: Pupils are equal, round, and reactive to light. Conjunctivae and EOM are normal.  Neck: Normal range of motion. Neck supple. No thyromegaly present.  Cardiovascular: Normal rate, regular rhythm and normal heart sounds.   No murmur heard. Pulmonary/Chest: Effort normal and breath sounds normal. No respiratory distress. He has no wheezes. He has no rales.  Abdominal: Soft. Bowel sounds are normal. He exhibits no distension. There is no tenderness.  Lymphadenopathy:    He has no cervical adenopathy.  Neurological: He is alert and oriented to person, place, and time. He has normal reflexes.  Skin: Skin is warm and dry.  Psychiatric: He has a normal mood and affect. His behavior is normal. Judgment and thought content normal.      Assessment & Plan:   Sylvain was seen today for private dot  physical.  Diagnoses and all orders for this visit:  Encounter for Department of Transportation (DOT) examination for driving license renewal  Other orders -     DULoxetine (CYMBALTA) 60 MG capsule; Take 1 capsule (60 mg total) by mouth daily.       I am having Mr. Code maintain his DULoxetine.  Allergies as of 08/30/2017   No Known Allergies     Medication List       Accurate as of 08/30/17  3:34 PM. Always use your most recent med list.          DULoxetine 60 MG capsule Commonly known as:   CYMBALTA Take 1 capsule (60 mg total) by mouth daily.            Discharge Care Instructions        Start     Ordered   08/30/17 0000  DULoxetine (CYMBALTA) 60 MG capsule  Daily    Comments:  D/C prior rx. Patient needs a 90 day supply and not 30.   08/30/17 1533       Follow-up: Return in about 6 months (around 02/27/2018).  Claretta Fraise, M.D.

## 2017-11-28 DIAGNOSIS — R591 Generalized enlarged lymph nodes: Secondary | ICD-10-CM | POA: Insufficient documentation

## 2017-11-28 DIAGNOSIS — M542 Cervicalgia: Secondary | ICD-10-CM | POA: Insufficient documentation

## 2018-05-21 ENCOUNTER — Other Ambulatory Visit: Payer: Self-pay | Admitting: Family Medicine

## 2018-05-22 NOTE — Telephone Encounter (Signed)
Pt scheduled 6/28 at 7:55 with Dr Livia Snellen.

## 2018-05-22 NOTE — Telephone Encounter (Signed)
Authorize 30 days only. Then contact the patient letting them know that they will need an appointment before any further prescriptions can be sent in. 

## 2018-05-22 NOTE — Telephone Encounter (Signed)
Last seen 08/30/17

## 2018-06-22 ENCOUNTER — Ambulatory Visit: Payer: BC Managed Care – PPO | Admitting: Family Medicine

## 2018-06-22 ENCOUNTER — Encounter: Payer: Self-pay | Admitting: Family Medicine

## 2018-06-22 VITALS — BP 122/79 | HR 58 | Temp 97.4°F | Ht 71.0 in | Wt 238.2 lb

## 2018-06-22 DIAGNOSIS — F411 Generalized anxiety disorder: Secondary | ICD-10-CM | POA: Diagnosis not present

## 2018-06-22 LAB — CBC WITH DIFFERENTIAL/PLATELET
Basophils Absolute: 0 x10E3/uL (ref 0.0–0.2)
Basos: 1 %
EOS (ABSOLUTE): 0.4 x10E3/uL (ref 0.0–0.4)
Eos: 5 %
Hematocrit: 47.7 % (ref 37.5–51.0)
Hemoglobin: 15.7 g/dL (ref 13.0–17.7)
Immature Grans (Abs): 0 x10E3/uL (ref 0.0–0.1)
Immature Granulocytes: 0 %
Lymphocytes Absolute: 1.9 x10E3/uL (ref 0.7–3.1)
Lymphs: 26 %
MCH: 28.7 pg (ref 26.6–33.0)
MCHC: 32.9 g/dL (ref 31.5–35.7)
MCV: 87 fL (ref 79–97)
Monocytes Absolute: 0.7 x10E3/uL (ref 0.1–0.9)
Monocytes: 9 %
Neutrophils Absolute: 4.4 x10E3/uL (ref 1.4–7.0)
Neutrophils: 59 %
Platelets: 312 x10E3/uL (ref 150–450)
RBC: 5.47 x10E6/uL (ref 4.14–5.80)
RDW: 13.5 % (ref 12.3–15.4)
WBC: 7.4 x10E3/uL (ref 3.4–10.8)

## 2018-06-22 LAB — CMP14+EGFR
ALT: 53 IU/L — ABNORMAL HIGH (ref 0–44)
AST: 22 IU/L (ref 0–40)
Albumin/Globulin Ratio: 2.1 (ref 1.2–2.2)
Albumin: 4.6 g/dL (ref 3.5–5.5)
Alkaline Phosphatase: 120 IU/L — ABNORMAL HIGH (ref 39–117)
BUN/Creatinine Ratio: 16 (ref 9–20)
BUN: 13 mg/dL (ref 6–20)
Bilirubin Total: 0.4 mg/dL (ref 0.0–1.2)
CO2: 24 mmol/L (ref 20–29)
Calcium: 9.7 mg/dL (ref 8.7–10.2)
Chloride: 102 mmol/L (ref 96–106)
Creatinine, Ser: 0.8 mg/dL (ref 0.76–1.27)
GFR calc Af Amer: 145 mL/min/1.73
GFR calc non Af Amer: 125 mL/min/1.73
Globulin, Total: 2.2 g/dL (ref 1.5–4.5)
Glucose: 90 mg/dL (ref 65–99)
Potassium: 4.7 mmol/L (ref 3.5–5.2)
Sodium: 139 mmol/L (ref 134–144)
Total Protein: 6.8 g/dL (ref 6.0–8.5)

## 2018-06-22 MED ORDER — DULOXETINE HCL 60 MG PO CPEP: ORAL_CAPSULE | ORAL | 1 refills | Status: DC

## 2018-06-22 NOTE — Progress Notes (Signed)
Subjective:  Patient ID: Devin Ware, male    DOB: [birth year]  Age: 22 y.o. MRN: [id number]  CC: Medical Management of Chronic Issues   HPI Devin Ware presents for Followup of anxiety and depression. Doing very well with cymbalta. Much improved. No anxiety sx. Tolerating it well. Denies loss of libido or delayed orgasm. BP running normal. No nausea. Taking I daily without sleep disturbances.   Depression screen [facility] 2/9 06/22/2018 08/30/2017 11/02/2016  Decreased Interest 0 0 0  Down, Depressed, Hopeless 0 0 0  PHQ - 2 Score 0 0 0  Altered sleeping - - -  Tired, decreased energy - - -  Change in appetite - - -  Feeling bad or failure about yourself  - - -  Trouble concentrating - - -  Moving slowly or fidgety/restless - - -  Suicidal thoughts - - -  PHQ-9 Score - - -  Difficult doing work/chores - - -    History Devin Ware has a past medical history of Mass on back (10/2013).   Devin Ware has a past surgical history that includes No past surgeries and Mass excision (Right, 11/15/2013).   His family history includes CAD in his unknown relative; CVA in his unknown relative; Diabetes Mellitus II in his unknown relative; Hyperlipidemia in his father; Hypertension in his father and mother.Devin Ware reports that Devin Ware has never smoked. Devin Ware has never used smokeless tobacco. Devin Ware reports that Devin Ware does not drink alcohol or use drugs.    ROS Review of Systems  Constitutional: Negative for fever.  Respiratory: Negative for shortness of breath.   Cardiovascular: Negative for chest pain.  Musculoskeletal: Negative for arthralgias.  Skin: Negative for rash.    Objective:  BP 122/79   Pulse (!) 58   Temp (!) 97.4 F (36.3 C) (Oral)   Ht _0  (1.803 m)   Wt 238 lb 4 oz (108.1 kg)   BMI 33.23 kg/m   BP Readings from Last 3 Encounters:  06/22/18 122/79  08/30/17 111/63  11/02/16 121/76    Wt Readings from Last 3 Encounters:  06/22/18 238 lb 4 oz (108.1 kg)  08/30/17 232 lb (105.2 kg)  11/02/16 238  lb (108 kg)     Physical Exam  Constitutional: Devin Ware is oriented to person, place, and time. Devin Ware appears well-developed and well-nourished. No distress.  HENT:  Head: Normocephalic and atraumatic.  Right Ear: External ear normal.  Left Ear: External ear normal.  Nose: Nose normal.  Mouth/Throat: Oropharynx is clear and moist.  Eyes: Pupils are equal, round, and reactive to light. Conjunctivae and EOM are normal.  Neck: Normal range of motion. Neck supple. No thyromegaly present.  Cardiovascular: Normal rate, regular rhythm and normal heart sounds.  No murmur heard. Pulmonary/Chest: Effort normal and breath sounds normal. No respiratory distress. Devin Ware has no wheezes. Devin Ware has no rales.  Abdominal: Soft. There is no tenderness.  Musculoskeletal: Normal range of motion. Devin Ware exhibits no edema, tenderness or deformity.  Lymphadenopathy:    Devin Ware has no cervical adenopathy.  Neurological: Devin Ware is alert and oriented to person, place, and time. Devin Ware has normal reflexes. Devin Ware displays normal reflexes. No cranial nerve deficit. Devin Ware exhibits normal muscle tone. Coordination normal.  Skin: Skin is warm and dry.  Psychiatric: Devin Ware has a normal mood and affect. His behavior is normal. Judgment and thought content normal.      Assessment & Plan:   Devin Ware was seen today for medical management of chronic issues.  Diagnoses and all  orders for this visit:  GAD (generalized anxiety disorder) -     CBC with Differential/Platelet -     CMP14+EGFR  Other orders -     DULoxetine (CYMBALTA) 60 MG capsule; TAKE 1 CAPSULE BY MOUTH EVERY DAY       I am having Devin Ware maintain his DULoxetine.  Allergies as of 06/22/2018   No Known Allergies     Medication List        Accurate as of 06/22/18  5:14 PM. Always use your most recent med list.          DULoxetine 60 MG capsule Commonly known as:  CYMBALTA TAKE 1 CAPSULE BY MOUTH EVERY DAY        Follow-up: Return in about 6 months (around  12/22/2018).  Claretta Fraise, M.D.

## 2018-06-22 NOTE — Patient Instructions (Signed)

## 2018-10-20 ENCOUNTER — Other Ambulatory Visit: Payer: Self-pay | Admitting: Otolaryngology

## 2018-10-20 DIAGNOSIS — M542 Cervicalgia: Secondary | ICD-10-CM

## 2018-10-20 DIAGNOSIS — R591 Generalized enlarged lymph nodes: Secondary | ICD-10-CM

## 2018-10-24 ENCOUNTER — Ambulatory Visit
Admission: RE | Admit: 2018-10-24 | Discharge: 2018-10-24 | Disposition: A | Discharge status: Home / Self Care | Payer: BC Managed Care – PPO | Source: Ambulatory Visit | Attending: Otolaryngology | Admitting: Otolaryngology

## 2018-10-24 DIAGNOSIS — R591 Generalized enlarged lymph nodes: Secondary | ICD-10-CM

## 2018-10-24 DIAGNOSIS — M542 Cervicalgia: Secondary | ICD-10-CM

## 2018-10-24 MED ORDER — IOPAMIDOL (ISOVUE-300) INJECTION 61%
75.0000 mL | Freq: Once | INTRAVENOUS | Status: AC | PRN
  Administered 2018-10-24: 75 mL via INTRAVENOUS

## 2018-12-21 ENCOUNTER — Ambulatory Visit: Payer: BC Managed Care – PPO | Admitting: Family Medicine

## 2019-02-03 ENCOUNTER — Other Ambulatory Visit: Payer: Self-pay | Admitting: Family Medicine

## 2019-02-04 NOTE — Telephone Encounter (Signed)
Last seen 06/22/18 

## 2019-02-04 NOTE — Telephone Encounter (Signed)
Authorize 30 days only. Then contact the patient letting them know that they will need an appointment before any further prescriptions can be sent in. 

## 2019-03-03 ENCOUNTER — Other Ambulatory Visit: Payer: Self-pay | Admitting: Family Medicine

## 2019-03-05 NOTE — Telephone Encounter (Signed)
Stacks. NTBS 30 days given 02/04/19

## 2019-03-05 NOTE — Telephone Encounter (Signed)
appt made to be seen  

## 2019-03-18 ENCOUNTER — Ambulatory Visit: Payer: BC Managed Care – PPO | Admitting: Family Medicine

## 2019-03-30 ENCOUNTER — Other Ambulatory Visit: Payer: Self-pay | Admitting: Family Medicine

## 2019-04-17 ENCOUNTER — Other Ambulatory Visit: Payer: Self-pay

## 2019-04-17 ENCOUNTER — Ambulatory Visit (INDEPENDENT_AMBULATORY_CARE_PROVIDER_SITE_OTHER): Payer: BC Managed Care – PPO | Admitting: Family Medicine

## 2019-04-17 ENCOUNTER — Encounter: Payer: Self-pay | Admitting: Family Medicine

## 2019-04-17 DIAGNOSIS — F411 Generalized anxiety disorder: Secondary | ICD-10-CM

## 2019-04-17 NOTE — Progress Notes (Addendum)
Subjective:    Patient ID: Devin Ware, male    DOB: [birth year], 22 y.o.   MRN: [id number]  CC: No chief complaint on file.   HPI: Devin Ware is a 23 y.o. male presenting for No chief complaint on file.   If I miss a day, I feel anxious the next day. Denies side effects including nausea, sleep disturbance, change of appetite - has lost about 15 lb since last visit. No loss of libido or delay of ejaculation.  GAD 7 : Generalized Anxiety Score 04/17/2019 04/20/2016  Nervous, Anxious, on Edge 1 3  Control/stop worrying 0 1  Worry too much - different things 0 1  Trouble relaxing 1 0  Restless 1 2  Easily annoyed or irritable 0 1  Afraid - awful might happen 1 1  Total GAD 7 Score 4 9  Anxiety Difficulty - Not difficult at all      Depression screen [facility] 2/9 06/22/2018 08/30/2017 11/02/2016 04/20/2016 09/08/2015  Decreased Interest 0 0 0 0 0  Down, Depressed, Hopeless 0 0 0 0 0  PHQ - 2 Score 0 0 0 0 0  Altered sleeping - - - 0 -  Tired, decreased energy - - - 0 -  Change in appetite - - - 0 -  Feeling bad or failure about yourself  - - - 0 -  Trouble concentrating - - - 0 -  Moving slowly or fidgety/restless - - - 0 -  Suicidal thoughts - - - 0 -  PHQ-9 Score - - - 0 -  Difficult doing work/chores - - - Not difficult at all -     Relevant past medical, surgical, family and social history reviewed and updated as indicated.  Interim medical history since our last visit reviewed. Allergies and medications reviewed and updated.  ROS:  Review of Systems  Constitutional: Negative for fever.  Respiratory: Negative for shortness of breath.   Cardiovascular: Negative for chest pain.  Musculoskeletal: Negative for arthralgias.  Skin: Negative for rash.     Social History   Tobacco Use  Smoking Status Never Smoker  Smokeless Tobacco Never Used       Objective:     Wt Readings from Last 3 Encounters:  06/22/18 238 lb 4 oz (108.1 kg)  08/30/17 232 lb (105.2 kg)   11/02/16 238 lb (108 kg)     Exam deferred. Pt. Harboring due to COVID 19. Phone visit performed.   Assessment & Plan:  No diagnosis found.  No orders of the defined types were placed in this encounter.   No orders of the defined types were placed in this encounter.     There are no diagnoses linked to this encounter.  Virtual Visit via telephone Note  I discussed the limitations, risks, security and privacy concerns of performing an evaluation and management service by telephone and the availability of in person appointments. The patient was identified with two identifiers. Pt.expressed understanding and agreed to proceed. Pt. Is at home. Dr. Livia Snellen is in his  office.  Follow Up Instructions:   I discussed the assessment and treatment plan with the patient. The patient was provided an opportunity to ask questions and all were answered. The patient agreed with the plan and demonstrated an understanding of the instructions.   The patient was advised to call back or seek an in-person evaluation if the symptoms worsen or if the condition fails to improve as anticipated.  Visit started: 9:58 Call ended:  10:12 Total minutes including chart review and phone contact time: 15   Follow up plan: Return in about 6 months (around 10/17/2019) for CPE.  Claretta Fraise, [facility] 04/17/2019, 10:12 AM

## 2019-04-20 ENCOUNTER — Other Ambulatory Visit: Payer: Self-pay | Admitting: Family Medicine

## 2019-04-22 ENCOUNTER — Other Ambulatory Visit: Payer: Self-pay | Admitting: Family Medicine

## 2019-04-22 MED ORDER — DULOXETINE HCL 60 MG PO CPEP: 60.0000 mg | ORAL_CAPSULE | Freq: Every day | ORAL | 1 refills | Status: DC

## 2019-04-22 NOTE — Telephone Encounter (Signed)
Med sent in - pt aware  

## 2019-07-24 IMAGING — CT CT NECK W/ CM
5 of 6 series · 14 of 33 positions shown, 16 images · IV contrast (iopamidol)
Comparison: Neck soft tissue ultrasound 08/12/2014.

CLINICAL DATA: 22-year-old male with neck pain, lymphadenopathy.
Chronic palpable abnormality.

EXAM:
CT NECK WITH CONTRAST
TECHNIQUE: Multidetector CT imaging of the neck was performed using the
standard protocol following the bolus administration of intravenous
contrast.
CONTRAST:  75mL M80QIZ-7DD IOPAMIDOL (M80QIZ-7DD) INJECTION 61%

[Series 3: neck 2.00 br40 s3 ax st · axial · 0.50mm/px · z∈[-767,-673]mm · 2 of 142 slices shown, 3 images]
[im 48/142  soft-tissue]
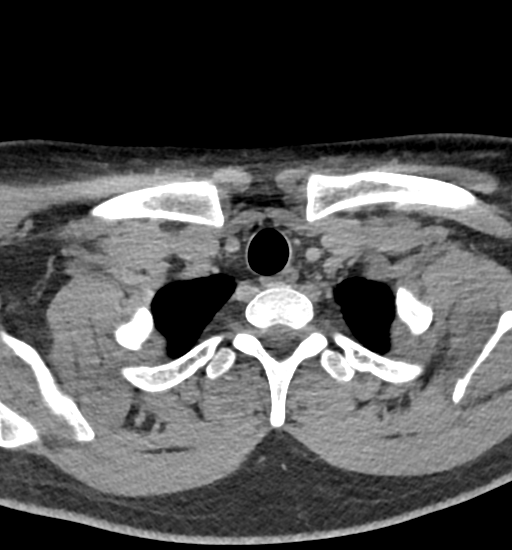
[im 48/142  bone]
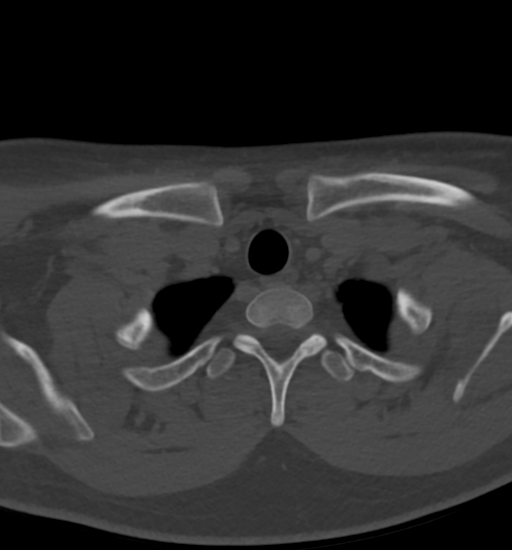
[im 95/142  bone]
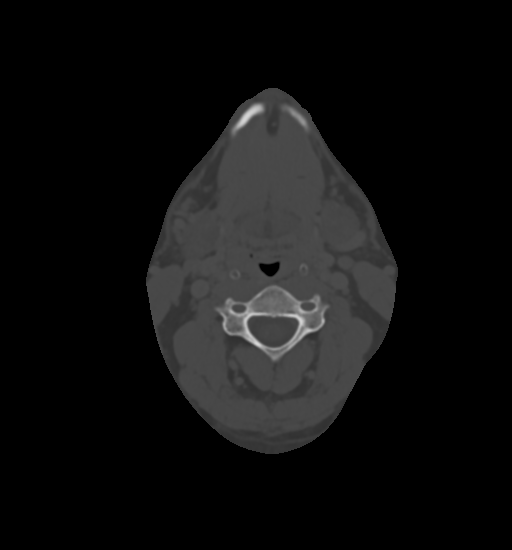

[Series 5: neck 2.00 br36 s3 ax hyoid · axial · 0.50mm/px · z∈[-767,-673]mm · 2 of 142 slices shown]
[im 48/142  bone]
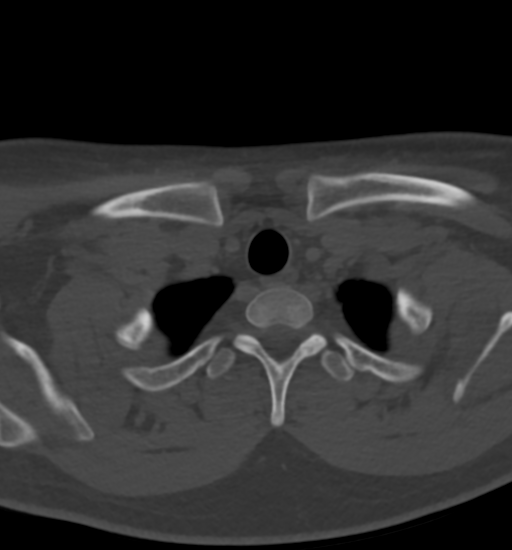
[im 95/142  bone]
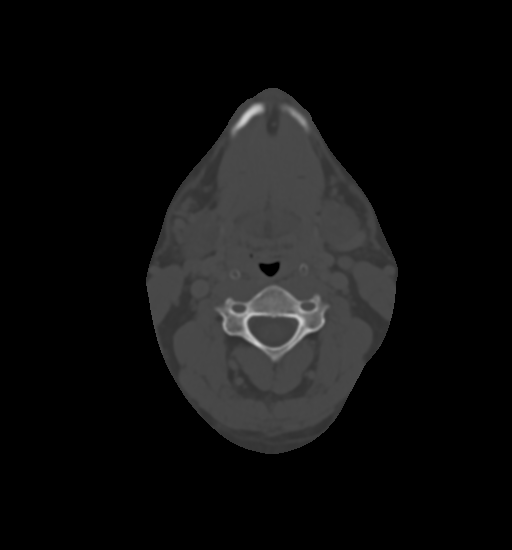

[Series 7: neck 2.00 br60 s3 ax bone · axial · 0.50mm/px · z∈[-767,-673]mm · 2 of 142 slices shown]
[im 48/142  bone]
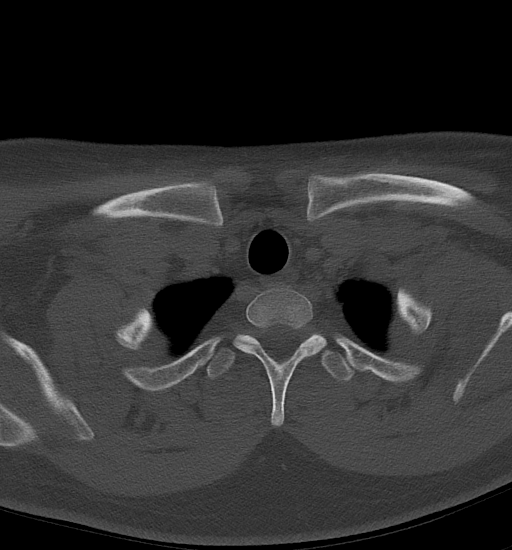
[im 95/142  bone]
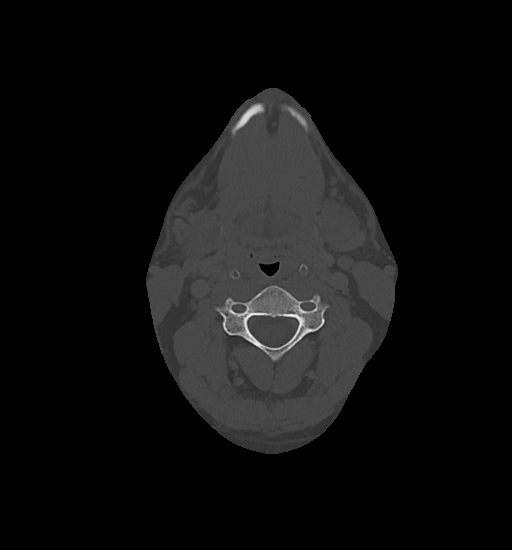

[Series 11: neck 2.00 br40 s3 (person_name) · coronal · 0.50mm/px · 3 of 137 slices shown]
[im 28/137  bone]
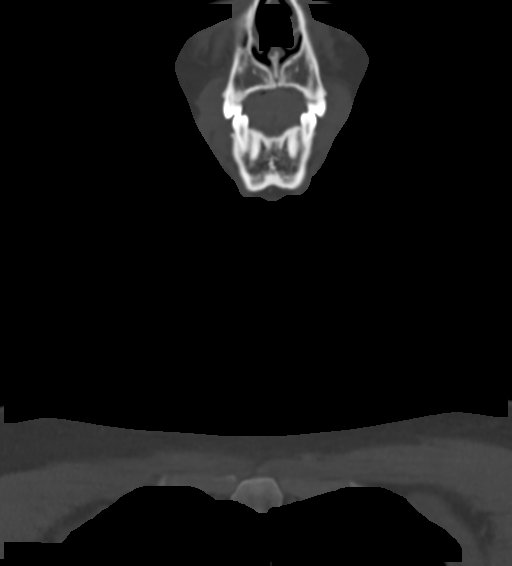
[im 55/137  bone]
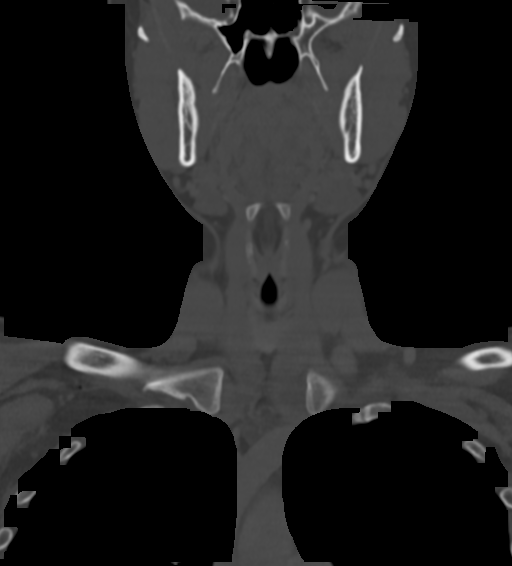
[im 82/137  bone]
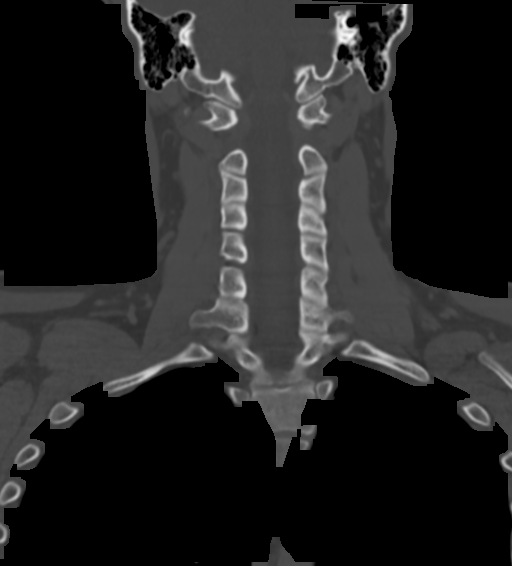

[Series 13: neck 2.00 br40 s3 sag st · sagittal · 0.54mm/px · 5 of 128 slices shown, 6 images]
[im 43/128  bone]
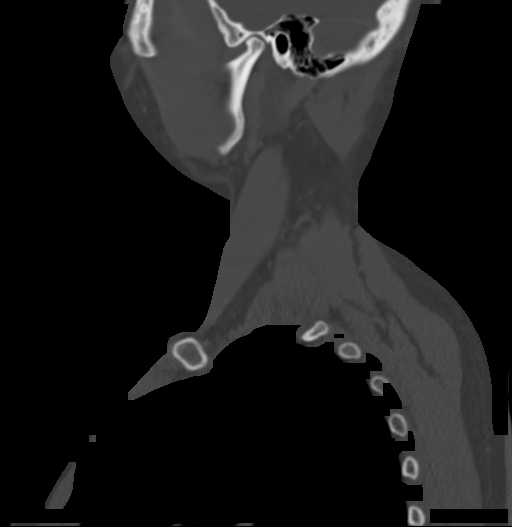
[im 53/128  bone]
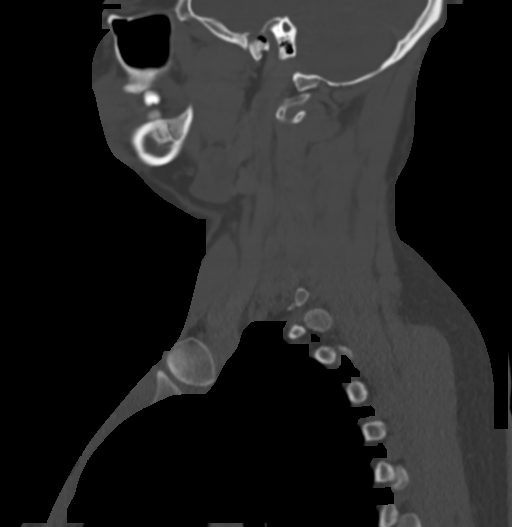
[im 64/128  soft-tissue]
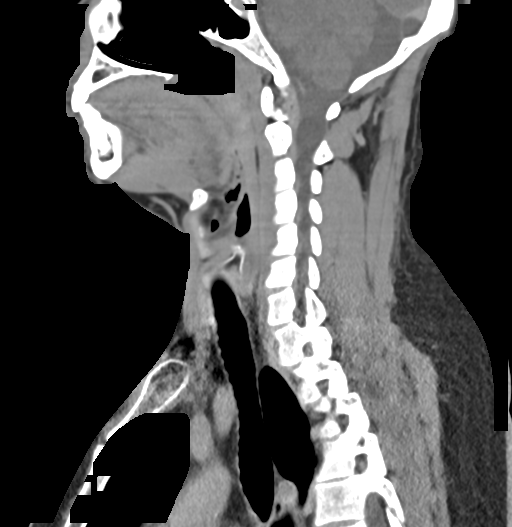
[im 64/128  bone]
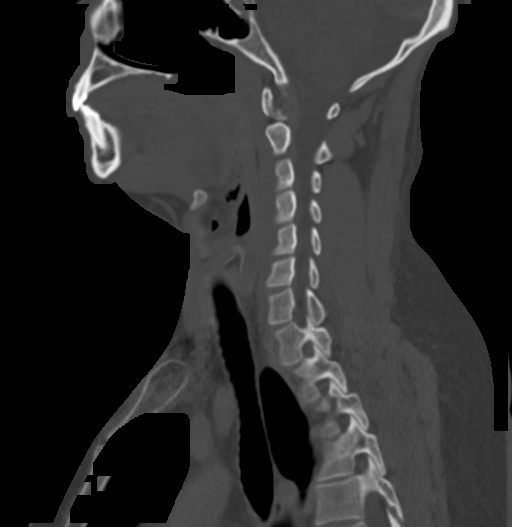
[im 75/128  bone]
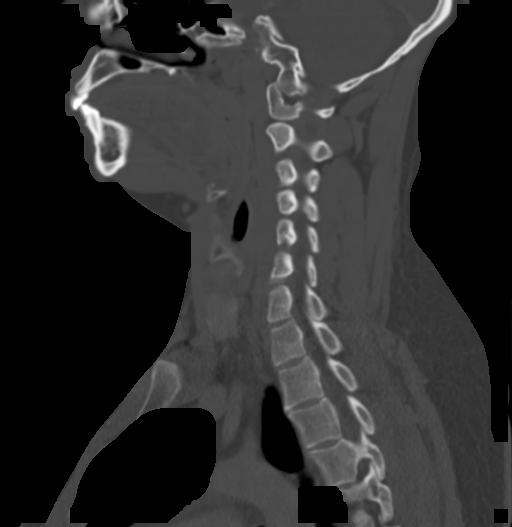
[im 85/128  bone]
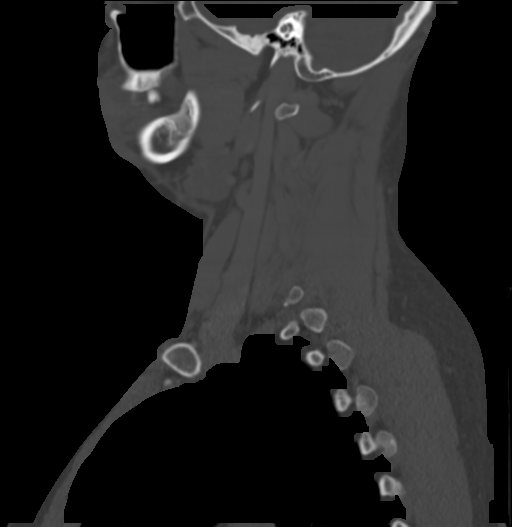

[14 of 33 positions shown; findings below may reference images not displayed]

FINDINGS: Pharynx and larynx: Glottis is closed. Otherwise negative larynx.
Pharyngeal soft tissue contours appear normal. Negative
parapharyngeal and retropharyngeal spaces.

Salivary glands: Negative sublingual space. Submandibular and
parotid glands appear symmetric and normal.

Thyroid: Negative.

Lymph nodes: Palpable area of concern marked at the lateral base of
the left neck as seen on coronal image 74. There are several small,
physiologic appearing lymph nodes in that region measuring 4-5
millimeter short axis. The appearance is similar to that on the 1787
neck CT.

No enlarged or abnormal cervical lymph nodes. No soft tissue
inflammation is evident.

Vascular: Suboptimal intravascular contrast bolus but the major
vascular structures in the neck and at the skull base appear to be
patent.

Limited intracranial: Negative.

Visualized orbits: Negative.

Mastoids and visualized paranasal sinuses: Largely clear. Minor
right maxillary alveolar recess mucosal thickening. Tympanic
cavities and mastoids are clear.

Skeleton: Negative.

Upper chest: The included lungs appear normal. Visible axillary
lymph nodes are normal. Normal superior mediastinum; small volume
residual thymus.
IMPRESSION: Normal CT appearance of the neck. Physiologic appearing lymph nodes
at the palpable area of concern at the left lower neck.

## 2019-10-01 ENCOUNTER — Encounter: Payer: BC Managed Care – PPO | Admitting: Family Medicine

## 2019-10-24 ENCOUNTER — Other Ambulatory Visit: Payer: Self-pay | Admitting: Family Medicine

## 2019-10-30 ENCOUNTER — Other Ambulatory Visit: Payer: Self-pay

## 2019-10-31 ENCOUNTER — Encounter: Payer: Self-pay | Admitting: Family Medicine

## 2019-10-31 ENCOUNTER — Ambulatory Visit (INDEPENDENT_AMBULATORY_CARE_PROVIDER_SITE_OTHER): Payer: BC Managed Care – PPO | Admitting: Family Medicine

## 2019-10-31 VITALS — BP 130/87 | HR 78 | Temp 97.5°F | Ht 71.0 in | Wt 236.0 lb

## 2019-10-31 DIAGNOSIS — R300 Dysuria: Secondary | ICD-10-CM

## 2019-10-31 DIAGNOSIS — Z0000 Encounter for general adult medical examination without abnormal findings: Secondary | ICD-10-CM

## 2019-10-31 LAB — URINALYSIS
Bilirubin, UA: NEGATIVE
Glucose, UA: NEGATIVE
Ketones, UA: NEGATIVE
Leukocytes,UA: NEGATIVE
Nitrite, UA: NEGATIVE
Protein,UA: NEGATIVE
RBC, UA: NEGATIVE
Specific Gravity, UA: 1.025 (ref 1.005–1.030)
Urobilinogen, Ur: 0.2 mg/dL (ref 0.2–1.0)
pH, UA: 6.5 (ref 5.0–7.5)

## 2019-11-03 ENCOUNTER — Encounter: Payer: Self-pay | Admitting: Family Medicine

## 2019-11-03 MED ORDER — DULOXETINE HCL 60 MG PO CPEP: 60.0000 mg | ORAL_CAPSULE | Freq: Two times a day (BID) | ORAL | 1 refills | Status: DC

## 2019-11-03 NOTE — Progress Notes (Signed)
Subjective:  Patient ID: Devin Ware, male    DOB: 04/13/1996  Age: 23 y.o. MRN: [id number]  CC: Annual Exam (dot) and Anxiety   HPI Devin Ware presents for CPE/DOT eval. Also feeling more anxious recently.  GAD 7 : Generalized Anxiety Score 10/31/2019 04/17/2019 04/20/2016  Nervous, Anxious, on Edge 1 1 3   Control/stop worrying 1 0 1  Worry too much - different things 1 0 1  Trouble relaxing 0 1 0  Restless 1 1 2   Easily annoyed or irritable 1 0 1  Afraid - awful might happen 2 1 1   Total GAD 7 Score 7 4 9   Anxiety Difficulty Not difficult at all - Not difficult at all      Depression screen [facility] 2/9 10/31/2019 06/22/2018 08/30/2017  Decreased Interest 0 0 0  Down, Depressed, Hopeless 0 0 0  PHQ - 2 Score 0 0 0  Altered sleeping - - -  Tired, decreased energy - - -  Change in appetite - - -  Feeling bad or failure about yourself  - - -  Trouble concentrating - - -  Moving slowly or fidgety/restless - - -  Suicidal thoughts - - -  PHQ-9 Score - - -  Difficult doing work/chores - - -    History Devin Ware has a past medical history of Mass on back (10/2013).   Devin Ware has a past surgical history that includes No past surgeries and Mass excision (Right, 11/15/2013).   His family history includes CAD in an other family member; CVA in an other family member; Diabetes Mellitus II in an other family member; Hyperlipidemia in his father; Hypertension in his father and mother.Devin Ware reports that Devin Ware has never smoked. Devin Ware has never used smokeless tobacco. Devin Ware reports that Devin Ware does not drink alcohol or use drugs.    ROS Review of Systems  Constitutional: Negative.   HENT: Negative.   Eyes: Negative for visual disturbance.  Respiratory: Negative for cough and shortness of breath.   Cardiovascular: Negative for chest pain and leg swelling.  Gastrointestinal: Negative for abdominal pain, diarrhea, nausea and vomiting.  Genitourinary: Negative for difficulty urinating.  Musculoskeletal:  Negative for arthralgias and myalgias.  Skin: Negative for rash.  Neurological: Negative for headaches.  Psychiatric/Behavioral: Negative for sleep disturbance. The patient is nervous/anxious.     Objective:  BP 130/87   Pulse 78   Temp (!) 97.5 F (36.4 C) (Temporal)   Ht 5\' 11"  (1.803 m)   Wt 236 lb (107 kg)   SpO2 97%   BMI 32.92 kg/m   BP Readings from Last 3 Encounters:  10/31/19 130/87  06/22/18 122/79  08/30/17 111/63    Wt Readings from Last 3 Encounters:  10/31/19 236 lb (107 kg)  06/22/18 238 lb 4 oz (108.1 kg)  08/30/17 232 lb (105.2 kg)     Physical Exam Constitutional:      Appearance: Devin Ware is well-developed.  HENT:     Head: Normocephalic and atraumatic.  Eyes:     Pupils: Pupils are equal, round, and reactive to light.  Neck:     Musculoskeletal: Normal range of motion.     Thyroid: No thyromegaly.     Trachea: No tracheal deviation.  Cardiovascular:     Rate and Rhythm: Normal rate and regular rhythm.     Heart sounds: Normal heart sounds. No murmur. No friction rub. No gallop.   Pulmonary:     Breath sounds: Normal breath sounds. No wheezing or rales.  Abdominal:     General: Bowel sounds are normal. There is no distension.     Palpations: Abdomen is soft. There is no mass.     Tenderness: There is no abdominal tenderness.     Hernia: There is no hernia in the left inguinal area.  Genitourinary:    Penis: Normal.      Scrotum/Testes: Normal.  Musculoskeletal: Normal range of motion.  Lymphadenopathy:     Cervical: No cervical adenopathy.  Skin:    General: Skin is warm and dry.  Neurological:     Mental Status: Devin Ware is alert and oriented to person, place, and time.       Assessment & Plan:   Devin Ware was seen today for annual exam and anxiety.  Diagnoses and all orders for this visit:  Dysuria -     Urinalysis- dip only  Other orders -     DULoxetine (CYMBALTA) 60 MG capsule; Take 1 capsule (60 mg total) by mouth 2 (two) times  daily.       I have changed Devin Ware's DULoxetine.  Allergies as of 10/31/2019   No Known Allergies     Medication List       Accurate as of October 31, 2019 11:59 PM. If you have any questions, ask your nurse or doctor.        DULoxetine 60 MG capsule Commonly known as: CYMBALTA Take 1 capsule (60 mg total) by mouth 2 (two) times daily. What changed:   how much to take  when to take this Changed by: Claretta Fraise, MD        Follow-up: No follow-ups on file.  Claretta Fraise, M.D.

## 2019-12-16 ENCOUNTER — Ambulatory Visit (INDEPENDENT_AMBULATORY_CARE_PROVIDER_SITE_OTHER): Payer: BC Managed Care – PPO | Admitting: Family Medicine

## 2019-12-16 ENCOUNTER — Encounter: Payer: Self-pay | Admitting: Family Medicine

## 2019-12-16 ENCOUNTER — Other Ambulatory Visit: Payer: Self-pay

## 2019-12-16 DIAGNOSIS — G4700 Insomnia, unspecified: Secondary | ICD-10-CM

## 2019-12-16 DIAGNOSIS — F411 Generalized anxiety disorder: Secondary | ICD-10-CM | POA: Diagnosis not present

## 2019-12-16 MED ORDER — DULOXETINE HCL 60 MG PO CPEP: 60.0000 mg | ORAL_CAPSULE | Freq: Every day | ORAL | 1 refills | Status: DC

## 2019-12-16 NOTE — Progress Notes (Signed)
Subjective:    Patient ID: Devin Ware, male    DOB: 1996/09/11, 23 y.o.   MRN: [id number]   HPI: Devin Ware is a 23 y.o. male presenting for recheck of anxiety. Couldn't tolerate duloxetine BID due to excessive drowsiness. Denies excessive worrying. Concentration is good. Feels drowsy sometimes. Sleep is variable. Some nights only getting 3-4 hours of sleep. At bedtime it can take two hours to get to sleep. Concerned he might have OCD.Double checks to make sure gates, doors are closed. He isn't overly organized. Denies compulsions.    Depression screen [facility] 2/9 10/31/2019 06/22/2018 08/30/2017 11/02/2016 04/20/2016  Decreased Interest 0 0 0 0 0  Down, Depressed, Hopeless 0 0 0 0 0  PHQ - 2 Score 0 0 0 0 0  Altered sleeping - - - - 0  Tired, decreased energy - - - - 0  Change in appetite - - - - 0  Feeling bad or failure about yourself  - - - - 0  Trouble concentrating - - - - 0  Moving slowly or fidgety/restless - - - - 0  Suicidal thoughts - - - - 0  PHQ-9 Score - - - - 0  Difficult doing work/chores - - - - Not difficult at all     Relevant past medical, surgical, family and social history reviewed and updated as indicated.  Interim medical history since our last visit reviewed. Allergies and medications reviewed and updated.  ROS:  Review of Systems  Constitutional: Negative.   HENT: Negative.   Respiratory: Negative for cough and shortness of breath.   Cardiovascular: Negative for chest pain.  Gastrointestinal: Negative for nausea and vomiting.  Musculoskeletal: Negative for arthralgias and myalgias.  Skin: Negative for rash.  Neurological: Negative for headaches.  Psychiatric/Behavioral: Negative for sleep disturbance.     Social History   Tobacco Use  Smoking Status Never Smoker  Smokeless Tobacco Never Used       Objective:     Wt Readings from Last 3 Encounters:  10/31/19 236 lb (107 kg)  06/22/18 238 lb 4 oz (108.1 kg)  08/30/17 232 lb (105.2 kg)     Exam deferred. Pt. Harboring due to COVID 19. Phone visit performed.   Assessment & Plan:   1. Insomnia, unspecified type   2. GAD (generalized anxiety disorder)     Meds ordered this encounter  Medications  . DULoxetine (CYMBALTA) 60 MG capsule    Sig: Take 1 capsule (60 mg total) by mouth daily.    Dispense:  180 capsule    Refill:  1    No orders of the defined types were placed in this encounter.     Diagnoses and all orders for this visit:  Insomnia, unspecified type  GAD (generalized anxiety disorder)  Other orders -     DULoxetine (CYMBALTA) 60 MG capsule; Take 1 capsule (60 mg total) by mouth daily.   Sleep hygiene reviewed Virtual Visit via telephone Note  I discussed the limitations, risks, security and privacy concerns of performing an evaluation and management service by telephone and the availability of in person appointments. The patient was identified with two identifiers. Pt.expressed understanding and agreed to proceed. Pt. Is at home. Dr. Livia Snellen is in his office.  Follow Up Instructions:   I discussed the assessment and treatment plan with the patient. The patient was provided an opportunity to ask questions and all were answered. The patient agreed with the plan and demonstrated an understanding  of the instructions.   The patient was advised to call back or seek an in-person evaluation if the symptoms worsen or if the condition fails to improve as anticipated.   Total minutes including chart review and phone contact time: 24   Follow up plan: Return in about 6 months (around 06/15/2020).  Claretta Fraise, MD [place]

## 2020-02-21 ENCOUNTER — Other Ambulatory Visit: Payer: Self-pay

## 2020-02-24 ENCOUNTER — Encounter: Payer: Self-pay | Admitting: Family Medicine

## 2020-02-24 ENCOUNTER — Other Ambulatory Visit: Payer: Self-pay

## 2020-02-24 ENCOUNTER — Ambulatory Visit: Payer: BC Managed Care – PPO | Admitting: Family Medicine

## 2020-02-24 VITALS — BP 132/83 | HR 83 | Temp 98.4°F | Ht 71.0 in | Wt 243.4 lb

## 2020-02-24 DIAGNOSIS — S40012A Contusion of left shoulder, initial encounter: Secondary | ICD-10-CM

## 2020-02-24 DIAGNOSIS — S3991XA Unspecified injury of abdomen, initial encounter: Secondary | ICD-10-CM

## 2020-02-24 MED ORDER — PREDNISONE 10 MG PO TABS: ORAL_TABLET | ORAL | 0 refills | Status: DC

## 2020-02-24 NOTE — Progress Notes (Signed)
Subjective:  Patient ID: Devin Ware, male    DOB: Apr 30, 1996  Age: 24 y.o. MRN: [id number]  CC: Groin Pain (left), Neck Pain (left), and Shoulder Pain (left)   HPI Devin Ware presents for patient has 2 concerns.  He had a fall and blow to the left shoulder around Christmas time.  The shoulder is still sore.  He is concerned that this might be caused by the lymph node previously evaluated.  The pain is primarily at the superior margin of the trapezius and radiates to the left side of the neck as well.  He can move the neck and the shoulder musculature without excessive discomfort.  Patient's groin pain has been present for several days.  No known injury.  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  I explained to him the meth mechanism of injury that generally leads to groin injuries of hyper extension and rotation outward of the groin region.  He says it is entirely possible that he did that as part of his work.  The pain is in the pubic region on the left and radiates along the inguinal canal but also down into the left testicle.  Depression screen [facility] 2/9 02/24/2020 10/31/2019 06/22/2018  Decreased Interest 0 0 0  Down, Depressed, Hopeless 0 0 0  PHQ - 2 Score 0 0 0  Altered sleeping - - -  Tired, decreased energy - - -  Change in appetite - - -  Feeling bad or failure about yourself  - - -  Trouble concentrating - - -  Moving slowly or fidgety/restless - - -  Suicidal thoughts - - -  PHQ-9 Score - - -  Difficult doing work/chores - - -    History Devin Ware has a past medical history of Mass on back (10/2013).   He has a past surgical history that includes No past surgeries and Mass excision (Right, 11/15/2013).   His family history includes CAD in an other family member; CVA in an other family member; Diabetes Mellitus II in an other family member; Hyperlipidemia in his father; Hypertension in his father and mother.He reports that he has never smoked. He has never used smokeless  tobacco. He reports that he does not drink alcohol or use drugs.    ROS Review of Systems  Constitutional: Positive for activity change. Negative for fever.  HENT: Negative.   Musculoskeletal: Positive for arthralgias. Negative for gait problem and joint swelling.    Objective:  BP 132/83   Pulse 83   Temp 98.4 F (36.9 C) (Temporal)   Ht 5\' 11"  (1.803 m)   Wt 243 lb 6.4 oz (110.4 kg)   BMI 33.95 kg/m   BP Readings from Last 3 Encounters:  02/24/20 132/83  10/31/19 130/87  06/22/18 122/79    Wt Readings from Last 3 Encounters:  02/24/20 243 lb 6.4 oz (110.4 kg)  10/31/19 236 lb (107 kg)  06/22/18 238 lb 4 oz (108.1 kg)     Physical Exam Vitals reviewed.  Constitutional:      Appearance: He is well-developed.  HENT:     Head: Normocephalic and atraumatic.     Right Ear: External ear normal.     Left Ear: External ear normal.     Mouth/Throat:     Pharynx: No oropharyngeal exudate or posterior oropharyngeal erythema.  Eyes:     Pupils: Pupils are equal, round, and reactive to light.  Cardiovascular:     Rate and Rhythm: Normal  rate and regular rhythm.     Heart sounds: No murmur.  Pulmonary:     Effort: No respiratory distress.     Breath sounds: Normal breath sounds.  Musculoskeletal:        General: Tenderness (Superior margin of the left trapezius.  Although range of motion and strength are normal.  Tender also at the left suprapubic region for deep palpation.  No hernia could be appreciated.  The testicles are free of lesions.) present. Normal range of motion.     Cervical back: Normal range of motion and neck supple.  Neurological:     Mental Status: He is alert and oriented to person, place, and time.       Assessment & Plan:   Devin Ware was seen today for groin pain, neck pain and shoulder pain.  Diagnoses and all orders for this visit:  Groin injury, initial encounter  Contusion of left shoulder, initial encounter  Other orders -      predniSONE (DELTASONE) 10 MG tablet; Take 5 daily for 3 days followed by 4,3,2 and 1 for 3 days each.       I am having Devin Ware start on predniSONE. I am also having him maintain his DULoxetine.  Allergies as of 02/24/2020   No Known Allergies     Medication List       Accurate as of February 24, 2020  4:33 PM. If you have any questions, ask your nurse or doctor.        DULoxetine 60 MG capsule Commonly known as: CYMBALTA Take 1 capsule (60 mg total) by mouth daily.   predniSONE 10 MG tablet Commonly known as: DELTASONE Take 5 daily for 3 days followed by 4,3,2 and 1 for 3 days each. Started by: Claretta Fraise, MD        Follow-up: No follow-ups on file.  Claretta Fraise, M.D.

## 2020-06-09 ENCOUNTER — Other Ambulatory Visit: Payer: Self-pay | Admitting: Family Medicine

## 2020-07-01 ENCOUNTER — Other Ambulatory Visit: Payer: Self-pay | Admitting: Family Medicine

## 2020-07-02 NOTE — Telephone Encounter (Signed)
Last OV 12/16/19. Last RF 06/09/20. Next OV not scheduled  ntbs for further refills

## 2020-07-06 ENCOUNTER — Other Ambulatory Visit: Payer: Self-pay | Admitting: Family Medicine

## 2020-07-06 NOTE — Telephone Encounter (Signed)
Called patient LMTCB. 

## 2021-03-15 ENCOUNTER — Other Ambulatory Visit: Payer: Self-pay | Admitting: Family Medicine

## 2021-03-15 MED ORDER — DULOXETINE HCL 60 MG PO CPEP: 60.0000 mg | ORAL_CAPSULE | Freq: Two times a day (BID) | ORAL | 0 refills | Status: DC

## 2021-03-15 NOTE — Telephone Encounter (Signed)
  Prescription Request  03/15/2021  What is the name of the medication or equipment? Duloxetine  Have you contacted your pharmacy to request a refill? (if applicable) yes  Which pharmacy would you like this sent to? [facility] in [place]   Patient notified that their request is being sent to the clinical staff for review and that they should receive a response within 2 business days.

## 2021-03-25 ENCOUNTER — Ambulatory Visit: Payer: BC Managed Care – PPO | Admitting: Family Medicine

## 2021-03-25 ENCOUNTER — Other Ambulatory Visit: Payer: Self-pay

## 2021-03-25 ENCOUNTER — Encounter: Payer: Self-pay | Admitting: Family Medicine

## 2021-03-25 VITALS — BP 116/78 | HR 86 | Temp 98.2°F | Ht 71.0 in | Wt 250.4 lb

## 2021-03-25 DIAGNOSIS — F411 Generalized anxiety disorder: Secondary | ICD-10-CM

## 2021-03-25 DIAGNOSIS — R1032 Left lower quadrant pain: Secondary | ICD-10-CM

## 2021-03-25 MED ORDER — DULOXETINE HCL 60 MG PO CPEP: 60.0000 mg | ORAL_CAPSULE | Freq: Every day | ORAL | 1 refills | Status: DC

## 2021-03-25 MED ORDER — QUETIAPINE FUMARATE 25 MG PO TABS: 25.0000 mg | ORAL_TABLET | Freq: Every day | ORAL | 1 refills | Status: DC

## 2021-03-26 LAB — CMP14+EGFR
ALT: 68 IU/L — ABNORMAL HIGH (ref 0–44)
AST: 30 IU/L (ref 0–40)
Albumin/Globulin Ratio: 2.1 (ref 1.2–2.2)
Albumin: 4.7 g/dL (ref 4.1–5.2)
Alkaline Phosphatase: 101 IU/L (ref 44–121)
BUN/Creatinine Ratio: 15 (ref 9–20)
BUN: 13 mg/dL (ref 6–20)
Bilirubin Total: 0.4 mg/dL (ref 0.0–1.2)
CO2: 21 mmol/L (ref 20–29)
Calcium: 9.5 mg/dL (ref 8.7–10.2)
Chloride: 105 mmol/L (ref 96–106)
Creatinine, Ser: 0.87 mg/dL (ref 0.76–1.27)
Globulin, Total: 2.2 g/dL (ref 1.5–4.5)
Glucose: 93 mg/dL (ref 65–99)
Potassium: 4.9 mmol/L (ref 3.5–5.2)
Sodium: 143 mmol/L (ref 134–144)
Total Protein: 6.9 g/dL (ref 6.0–8.5)
eGFR: 121 mL/min/{1.73_m2} (ref >59)

## 2021-03-26 LAB — CBC WITH DIFFERENTIAL/PLATELET
Basophils Absolute: 0.1 10*3/uL (ref 0.0–0.2)
Basos: 1 %
EOS (ABSOLUTE): 0.3 10*3/uL (ref 0.0–0.4)
Eos: 4 %
Hematocrit: 46.9 % (ref 37.5–51.0)
Hemoglobin: 16.3 g/dL (ref 13.0–17.7)
Immature Grans (Abs): 0 10*3/uL (ref 0.0–0.1)
Immature Granulocytes: 0 %
Lymphocytes Absolute: 1.9 10*3/uL (ref 0.7–3.1)
Lymphs: 27 %
MCH: 29.5 pg (ref 26.6–33.0)
MCHC: 34.8 g/dL (ref 31.5–35.7)
MCV: 85 fL (ref 79–97)
Monocytes Absolute: 0.6 10*3/uL (ref 0.1–0.9)
Monocytes: 9 %
Neutrophils Absolute: 4.1 10*3/uL (ref 1.4–7.0)
Neutrophils: 59 %
Platelets: 291 10*3/uL (ref 150–450)
RBC: 5.52 x10E6/uL (ref 4.14–5.80)
RDW: 13 % (ref 11.6–15.4)
WBC: 7 10*3/uL (ref 3.4–10.8)

## 2021-03-28 ENCOUNTER — Encounter: Payer: Self-pay | Admitting: Family Medicine

## 2021-03-28 NOTE — Progress Notes (Signed)
Hello Devin Ware,  Your lab result is normal and/or stable.Some minor variations that are not significant are commonly marked abnormal, but do not represent any medical problem for you.  Best regards, Claretta Fraise, M.D.

## 2021-03-28 NOTE — Progress Notes (Signed)
Subjective:  Patient ID: Devin Ware, male    DOB: [birth year]  Age: 25 y.o. MRN: [id number]  CC: Medication Refill   HPI Devin Ware presents for follow-up for his anxiety and depression.  He says that recently anxiety has increased.  Particularly he has anxiety with being in crowded [place] is in public.  He has some anxiety with driving but that is actually getting better.  Patient mentions that he had been told he had a lump in his right testicle a long time ago.  He has had some groin pain as well.  There is some concern for testicular cancer.  GAD 7 : Generalized Anxiety Score 03/26/2021 10/31/2019 04/17/2019 04/20/2016  Nervous, Anxious, on Edge _0 Control/stop worrying 2 1 0 1  Worry too much - different things 2 1 0 1  Trouble relaxing 0 0 1 0  Restless 0 _1 Easily annoyed or irritable 0 1 0 1  Afraid - awful might happen _2 Total GAD 7 Score _3 Anxiety Difficulty Not difficult at all Not difficult at all - Not difficult at all      Depression screen [facility] 2/9 03/26/2021 03/25/2021 02/24/2020  Decreased Interest 0 0 0  Down, Depressed, Hopeless 0 0 0  PHQ - 2 Score 0 0 0  Altered sleeping 0 - -  Tired, decreased energy 1 - -  Change in appetite 1 - -  Feeling bad or failure about yourself  1 - -  Trouble concentrating 1 - -  Moving slowly or fidgety/restless 0 - -  Suicidal thoughts 0 - -  PHQ-9 Score 4 - -  Difficult doing work/chores Somewhat difficult - -    History Devin Ware has a past medical history of Mass on back (10/2013).   He has a past surgical history that includes No past surgeries and Mass excision (Right, 11/15/2013).   His family history includes CAD in an other family member; CVA in an other family member; Diabetes Mellitus II in an other family member; Hyperlipidemia in his father; Hypertension in his father and mother.He reports that he has never smoked. He has never used smokeless tobacco. He reports that he does not drink alcohol  and does not use drugs.    ROS Review of Systems  Constitutional: Negative for fever.  Respiratory: Negative for shortness of breath.   Cardiovascular: Negative for chest pain.  Genitourinary: Positive for testicular pain.  Musculoskeletal: Negative for arthralgias.  Skin: Negative for rash.    Objective:  BP 116/78   Pulse 86   Temp 98.2 F (36.8 C)   Ht _4  (1.803 m)   Wt 250 lb 6.4 oz (113.6 kg)   SpO2 97%   BMI 34.92 kg/m   BP Readings from Last 3 Encounters:  03/25/21 116/78  02/24/20 132/83  10/31/19 130/87    Wt Readings from Last 3 Encounters:  03/25/21 250 lb 6.4 oz (113.6 kg)  02/24/20 243 lb 6.4 oz (110.4 kg)  10/31/19 236 lb (107 kg)     Physical Exam Vitals reviewed.  Constitutional:      Appearance: He is well-developed.  HENT:     Head: Normocephalic and atraumatic.     Right Ear: External ear normal.     Left Ear: External ear normal.     Mouth/Throat:     Pharynx: No oropharyngeal exudate or posterior oropharyngeal erythema.  Eyes:  Pupils: Pupils are equal, round, and reactive to light.  Cardiovascular:     Rate and Rhythm: Normal rate and regular rhythm.     Heart sounds: No murmur heard.   Pulmonary:     Effort: No respiratory distress.     Breath sounds: Normal breath sounds.  Abdominal:     Hernia: There is no hernia in the left inguinal area or right inguinal area.  Genitourinary:    Penis: Normal and circumcised.      Testes:        Right: Tenderness (There is a rounded freely movable 4 mm cystic structure in the superior portion of the scrotum on the right.) present.        Left: Tenderness not present.     Epididymis:     Right: Normal.     Left: Normal.     Tanner stage (genital): 5.  Musculoskeletal:     Cervical back: Normal range of motion and neck supple.  Neurological:     Mental Status: He is alert and oriented to person, place, and time.       Assessment & Plan:   Devin Ware was seen today for medication  refill.  Diagnoses and all orders for this visit:  GAD (generalized anxiety disorder) -     CBC with Differential/Platelet -     CMP14+EGFR  Left inguinal pain -     CBC with Differential/Platelet -     CMP14+EGFR  Other orders -     QUEtiapine (SEROQUEL) 25 MG tablet; Take 1 tablet (25 mg total) by mouth at bedtime. -     DULoxetine (CYMBALTA) 60 MG capsule; Take 1 capsule (60 mg total) by mouth daily.       I have discontinued Devin Ware's predniSONE. I have also changed his DULoxetine. Additionally, I am having him start on QUEtiapine.  Allergies as of 03/25/2021   No Known Allergies     Medication List       Accurate as of March 25, 2021 11:59 PM. If you have any questions, ask your nurse or doctor.        STOP taking these medications   predniSONE 10 MG tablet Commonly known as: DELTASONE Stopped by: Claretta Fraise, MD     TAKE these medications   DULoxetine 60 MG capsule Commonly known as: CYMBALTA Take 1 capsule (60 mg total) by mouth daily. What changed: when to take this Changed by: Claretta Fraise, MD   QUEtiapine 25 MG tablet Commonly known as: SEROQUEL Take 1 tablet (25 mg total) by mouth at bedtime. Started by: Claretta Fraise, MD        Follow-up: Return in about 6 months (around 09/24/2021), or if symptoms worsen or fail to improve.  Claretta Fraise, M.D.

## 2021-04-12 ENCOUNTER — Telehealth: Payer: Self-pay

## 2021-04-12 ENCOUNTER — Other Ambulatory Visit: Payer: Self-pay | Admitting: Family Medicine

## 2021-04-12 MED ORDER — DULOXETINE HCL 60 MG PO CPEP: 60.0000 mg | ORAL_CAPSULE | Freq: Two times a day (BID) | ORAL | 1 refills | Status: DC

## 2021-04-12 NOTE — Telephone Encounter (Signed)
I am sorry the Seroquel did not work out.  Discontinue that medication.  Increase the Cymbalta to 1 in the morning and 1 in the evening.

## 2021-04-12 NOTE — Telephone Encounter (Signed)
Patient complains that the Seroquel is making him drowsy all day.

## 2021-04-19 ENCOUNTER — Other Ambulatory Visit: Payer: Self-pay | Admitting: Family Medicine

## 2021-04-19 MED ORDER — DULOXETINE HCL 30 MG PO CPEP: 90.0000 mg | ORAL_CAPSULE | Freq: Every day | ORAL | 2 refills | Status: DC

## 2021-04-19 NOTE — Telephone Encounter (Signed)
Yes, change the Cymbalta to 90 mg at evening meal. Use 3 X 30 mg capsules. Scrip sent.

## 2021-04-19 NOTE — Telephone Encounter (Signed)
Patient advised to do one Cymbalta in the morning and one in the evening.  He said he had tried this before and it made him too drowsy.  He would like to know if it is possible for him to do a lower dosage in the morning and then the normal dose in the evening.  Patient uses [place].  Please advise.

## 2021-05-15 ENCOUNTER — Other Ambulatory Visit: Payer: Self-pay | Admitting: Family Medicine

## 2021-08-11 ENCOUNTER — Other Ambulatory Visit: Payer: Self-pay | Admitting: Family Medicine

## 2021-09-03 ENCOUNTER — Other Ambulatory Visit: Payer: Self-pay | Admitting: Family Medicine

## 2021-09-24 ENCOUNTER — Ambulatory Visit: Payer: BC Managed Care – PPO | Admitting: Family Medicine

## 2021-11-03 ENCOUNTER — Ambulatory Visit: Payer: BC Managed Care – PPO | Admitting: Family Medicine

## 2021-11-04 ENCOUNTER — Encounter: Payer: Self-pay | Admitting: Family Medicine

## 2022-01-26 ENCOUNTER — Encounter: Payer: Self-pay | Admitting: Family Medicine

## 2022-01-26 ENCOUNTER — Ambulatory Visit: Payer: BC Managed Care – PPO | Admitting: Family Medicine

## 2022-01-26 VITALS — BP 124/74 | HR 75 | Temp 97.2°F | Ht 71.0 in | Wt 253.6 lb

## 2022-01-26 DIAGNOSIS — R1032 Left lower quadrant pain: Secondary | ICD-10-CM | POA: Diagnosis not present

## 2022-01-26 MED ORDER — DICLOFENAC SODIUM 75 MG PO TBEC: 75.0000 mg | DELAYED_RELEASE_TABLET | Freq: Two times a day (BID) | ORAL | 0 refills | Status: DC

## 2022-01-26 MED ORDER — DULOXETINE HCL 60 MG PO CPEP: 60.0000 mg | ORAL_CAPSULE | Freq: Every day | ORAL | 3 refills | Status: DC

## 2022-01-26 NOTE — Progress Notes (Signed)
Subjective:  Patient ID: Devin Ware, male    DOB: February 04, 1996  Age: 26 y.o. MRN: [id number]  CC: Groin Pain   HPI Devin Ware presents for pain from left testicle into left groin and LLQ. ? Swollen. MIld to moderate discomfort.. Did a lot of lifting a few days ago and pain was worse.     Depression screen [facility] 2/9 01/26/2022 03/26/2021 03/25/2021  Decreased Interest 0 0 0  Down, Depressed, Hopeless 0 0 0  PHQ - 2 Score 0 0 0  Altered sleeping - 0 -  Tired, decreased energy - 1 -  Change in appetite - 1 -  Feeling bad or failure about yourself  - 1 -  Trouble concentrating - 1 -  Moving slowly or fidgety/restless - 0 -  Suicidal thoughts - 0 -  PHQ-9 Score - 4 -  Difficult doing work/chores - Somewhat difficult -    History Devin Ware has a past medical history of Mass on back (10/2013).   Devin Ware has a past surgical history that includes No past surgeries and Mass excision (Right, 11/15/2013).   His family history includes CAD in an other family member; CVA in an other family member; Diabetes Mellitus II in an other family member; Hyperlipidemia in his father; Hypertension in his father and mother.Devin Ware reports that Devin Ware has never smoked. Devin Ware has never used smokeless tobacco. Devin Ware reports that Devin Ware does not drink alcohol and does not use drugs.    ROS Review of Systems  Constitutional:  Negative for fever.  Genitourinary:  Positive for testicular pain. Negative for dysuria, penile pain and scrotal swelling.   Objective:  BP 124/74    Pulse 75    Temp (!) 97.2 F (36.2 C)    Ht 5\' 11"  (1.803 m)    Wt 253 lb 9.6 oz (115 kg)    SpO2 99%    BMI 35.37 kg/m   BP Readings from Last 3 Encounters:  01/26/22 124/74  03/25/21 116/78  02/24/20 132/83    Wt Readings from Last 3 Encounters:  01/26/22 253 lb 9.6 oz (115 kg)  03/25/21 250 lb 6.4 oz (113.6 kg)  02/24/20 243 lb 6.4 oz (110.4 kg)     Physical Exam Abdominal:     Hernia: There is no hernia in the left inguinal area or right  inguinal area.  Genitourinary:    Penis: No phimosis, hypospadias or tenderness.      Testes: Normal. Cremasteric reflex is present.        Right: Testicular hydrocele or varicocele not present.        Left: Testicular hydrocele or varicocele not present.      Assessment & Plan:   Devin Ware was seen today for groin pain.  Diagnoses and all orders for this visit:  Left inguinal pain  Other orders -     diclofenac (VOLTAREN) 75 MG EC tablet; Take 1 tablet (75 mg total) by mouth 2 (two) times daily. For muscle and  Joint pain -     DULoxetine (CYMBALTA) 60 MG capsule; Take 1 capsule (60 mg total) by mouth daily.       I have changed Rebecca T. Grimme's DULoxetine. I am also having him start on diclofenac.  Allergies as of 01/26/2022   No Known Allergies      Medication List        Accurate as of January 26, 2022 12:11 PM. If you have any questions, ask your nurse or doctor.  diclofenac 75 MG EC tablet Commonly known as: VOLTAREN Take 1 tablet (75 mg total) by mouth 2 (two) times daily. For muscle and  Joint pain Started by: Claretta Fraise, MD   DULoxetine 60 MG capsule Commonly known as: CYMBALTA Take 1 capsule (60 mg total) by mouth daily. What changed:  medication strength See the new instructions. Changed by: Claretta Fraise, MD         Follow-up: Return if symptoms worsen or fail to improve.  Claretta Fraise, M.D.

## 2022-02-08 ENCOUNTER — Encounter: Payer: BC Managed Care – PPO | Admitting: Family Medicine

## 2022-02-16 ENCOUNTER — Ambulatory Visit (INDEPENDENT_AMBULATORY_CARE_PROVIDER_SITE_OTHER): Payer: Self-pay | Admitting: Family Medicine

## 2022-02-16 ENCOUNTER — Encounter: Payer: Self-pay | Admitting: Family Medicine

## 2022-02-16 VITALS — BP 142/77 | HR 85 | Temp 97.7°F | Ht 71.0 in | Wt 258.0 lb

## 2022-02-16 DIAGNOSIS — Z024 Encounter for examination for driving license: Secondary | ICD-10-CM

## 2022-02-16 LAB — URINALYSIS
Bilirubin, UA: NEGATIVE
Glucose, UA: NEGATIVE
Ketones, UA: NEGATIVE
Leukocytes,UA: NEGATIVE
Nitrite, UA: NEGATIVE
Protein,UA: NEGATIVE
RBC, UA: NEGATIVE
Specific Gravity, UA: 1.015 (ref 1.005–1.030)
Urobilinogen, Ur: 4 mg/dL — ABNORMAL HIGH (ref 0.2–1.0)
pH, UA: 7 (ref 5.0–7.5)

## 2022-02-16 NOTE — Progress Notes (Signed)
DOT exam performed.  See attached forms. Claretta Fraise MD

## 2023-03-19 ENCOUNTER — Other Ambulatory Visit: Payer: Self-pay | Admitting: Family Medicine

## 2023-03-20 ENCOUNTER — Encounter: Payer: Self-pay | Admitting: Family Medicine

## 2023-03-20 NOTE — Telephone Encounter (Signed)
Stacks NTBS last OV 02/16/22

## 2023-03-20 NOTE — Telephone Encounter (Signed)
LMTCB to schedule appt Letter mailed 

## 2023-10-24 ENCOUNTER — Ambulatory Visit: Payer: BC Managed Care – PPO | Admitting: Podiatry

## 2023-11-13 ENCOUNTER — Ambulatory Visit: Payer: BC Managed Care – PPO | Admitting: Podiatry

## 2024-02-19 ENCOUNTER — Telehealth: Payer: Self-pay | Admitting: Family Medicine

## 2024-02-19 NOTE — Telephone Encounter (Signed)
 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  Copied from CRM [id number]. Topic: General - Other >> Feb 19, 2024 11:54 AM Eunice Blase wrote: Reason for CRM: Pt called wants to know if still provide CDL Medical Card Physical. Please call pt [phone]

## 2024-07-01 ENCOUNTER — Ambulatory Visit: Payer: Self-pay

## 2024-07-01 NOTE — Telephone Encounter (Signed)
  FYI Only or Action Required?: FYI only for provider.  Patient was last seen in primary care on 02/16/2022 by Zollie Lowers, MD. Called Nurse Triage reporting Abdominal Pain. Symptoms began several months ago. Interventions attempted: Nothing. Symptoms are: unchanged.  Triage Disposition: See HCP Within 4 Hours (Or PCP Triage) apt tomorrow per request.  Patient/caregiver understands and will follow disposition?: Yes      Copied from CRM [id number]. Topic: Clinical - Red Word Triage >> Jul 01, 2024 10:54 AM Willma R wrote: [facility] that prompted transfer to Nurse Triage: Patient states over the last few months has had pain in his lower abdomen and groin area. It comes and goes, but has gotten worse over the last week. Reason for Disposition  [1] MILD-MODERATE pain AND [2] constant AND [3] present > 2 hours  Answer Assessment - Initial Assessment Questions 1. LOCATION: Where does it hurt?      Lower abd 2. RADIATION: Does the pain shoot anywhere else? (e.g., chest, back)     Towards groin 3. ONSET: When did the pain begin? (Minutes, hours or days ago)      Few months ago 4. SUDDEN: Gradual or sudden onset?     gradually 5. PATTERN Does the pain come and go, or is it constant?    - If it comes and goes: How long does it last? Do you have pain now?     (Note: Comes and goes means the pain is intermittent. It goes away completely between bouts.)    - If constant: Is it getting better, staying the same, or getting worse?      (Note: Constant means the pain never goes away completely; most serious pain is constant and gets worse.)      constant 6. SEVERITY: How bad is the pain?  (e.g., Scale 1-10; mild, moderate, or severe)    - MILD (1-3): Doesn't interfere with normal activities, abdomen soft and not tender to touch.     - MODERATE (4-7): Interferes with normal activities or awakens from sleep, abdomen tender to touch.     - SEVERE (8-10): Excruciating pain, doubled  over, unable to do any normal activities.       mild 7. RECURRENT SYMPTOM: Have you ever had this type of stomach pain before? If Yes, ask: When was the last time? and What happened that time?      Yes, years ago, had pulled muscle 8. CAUSE: What do you think is causing the stomach pain?     unknown 9. RELIEVING/AGGRAVATING FACTORS: What makes it better or worse? (e.g., antacids, bending or twisting motion, bowel movement)     constant 10. OTHER SYMPTOMS: Do you have any other symptoms? (e.g., back pain, diarrhea, fever, urination pain, vomiting)       Groin pain.  Protocols used: Abdominal Pain - Male-A-AH

## 2024-07-01 NOTE — Telephone Encounter (Signed)
 E2C2 scheduled appointment.

## 2024-07-02 ENCOUNTER — Encounter: Payer: Self-pay | Admitting: Nurse Practitioner

## 2024-07-02 ENCOUNTER — Ambulatory Visit: Payer: Self-pay | Admitting: Nurse Practitioner

## 2024-07-02 ENCOUNTER — Ambulatory Visit (HOSPITAL_COMMUNITY)
Admission: RE | Admit: 2024-07-02 | Discharge: 2024-07-02 | Disposition: A | Discharge status: Home / Self Care | Source: Ambulatory Visit | Attending: Nurse Practitioner | Admitting: Nurse Practitioner

## 2024-07-02 VITALS — BP 130/88 | HR 69 | Temp 97.4°F | Ht 71.0 in | Wt 248.2 lb

## 2024-07-02 DIAGNOSIS — R55 Syncope and collapse: Secondary | ICD-10-CM | POA: Diagnosis not present

## 2024-07-02 DIAGNOSIS — F410 Panic disorder [episodic paroxysmal anxiety] without agoraphobia: Secondary | ICD-10-CM

## 2024-07-02 DIAGNOSIS — N50812 Left testicular pain: Secondary | ICD-10-CM | POA: Insufficient documentation

## 2024-07-02 MED ORDER — DICLOFENAC SODIUM 75 MG PO TBEC: 75.0000 mg | DELAYED_RELEASE_TABLET | Freq: Two times a day (BID) | ORAL | 0 refills | Status: AC

## 2024-07-02 MED ORDER — HYDROXYZINE PAMOATE 25 MG PO CAPS: 25.0000 mg | ORAL_CAPSULE | Freq: Three times a day (TID) | ORAL | 0 refills | Status: AC | PRN

## 2024-07-02 NOTE — Progress Notes (Signed)
 Acute Office Visit  Subjective:     Patient ID: Devin Ware, male    DOB: 03/16/1996, 28 y.o.   MRN: [id number]  Chief Complaint  Patient presents with   Abdominal Pain    Lower left abdominal pain radiates into groin. Has happened before but has been hurting for past week    Devin Ware is a 28 yrs old male presents 07/02/2024 for an acute visit concerns for left testicle  LLQ  that radiate to. The pain is constant 2/10 described aching. Reports his left testicle is at painful to touch and He reports heavy lifting on a normal basis  at 50 lbs Abdominal Pain Pertinent negatives include no fever, headaches or nausea.   New near syncope [place] was in the waiting  and felt that  I feel like I was about to pass out. HR was in the 120s. Reports history of panic attack I didn't take Buspar before I left the house. He is agreeable to get an EKG   Active Ambulatory Problems    Diagnosis Date Noted   Mass on back, right lumbar region 10/30/2013   Annual physical exam 09/03/2015   Lymphadenopathy 11/28/2017   Neck pain 11/28/2017   Testicular pain, left 07/02/2024   Near syncope 07/02/2024   Resolved Ambulatory Problems    Diagnosis Date Noted   No Resolved Ambulatory Problems   No Additional Past Medical History    Review of Systems  Constitutional:  Negative for chills and fever.  HENT:  Negative for congestion and sore throat.   Cardiovascular:  Negative for chest pain and leg swelling.  Gastrointestinal:  Positive for abdominal pain. Negative for nausea.  Genitourinary:        Testicular  Neurological:  Negative for dizziness and headaches.   Negative unless indicated in HPI    Objective:    BP 130/88   Pulse 69   Temp (!) 97.4 F (36.3 C) (Temporal)   Ht 5' 11 (1.803 m)   Wt 248 lb 3.2 oz (112.6 kg)   SpO2 97%   BMI 34.62 kg/m  BP Readings from Last 3 Encounters:  07/02/24 130/88  02/16/22 (!) 142/77  01/26/22 124/74   Wt Readings from Last 3  Encounters:  07/02/24 248 lb 3.2 oz (112.6 kg)  02/16/22 258 lb (117 kg)  01/26/22 253 lb 9.6 oz (115 kg)      Physical Exam Vitals and nursing note reviewed.  Constitutional:      General: He is not in acute distress. HENT:     Head: Normocephalic and atraumatic.     Mouth/Throat:     Mouth: Mucous membranes are moist.  Eyes:     General: No scleral icterus.    Extraocular Movements: Extraocular movements intact.     Conjunctiva/sclera: Conjunctivae normal.     Pupils: Pupils are equal, round, and reactive to light.  Cardiovascular:     Heart sounds: Normal heart sounds.  Pulmonary:     Effort: Pulmonary effort is normal.     Breath sounds: Normal breath sounds.  Abdominal:     Tenderness: There is abdominal tenderness in the left lower quadrant.  Musculoskeletal:     Right lower leg: No edema.     Left lower leg: No edema.  Skin:    General: Skin is warm and dry.     Findings: No rash.  Neurological:     Mental Status: He is alert and oriented to person, place, and time.  Psychiatric:        Mood and Affect: Mood normal.        Behavior: Behavior normal.        Thought Content: Thought content normal.        Judgment: Judgment normal.     No results found for any visits on 07/02/24.      Assessment & Plan:  Testicular pain, left -     Diclofenac  Sodium; Take 1 tablet (75 mg total) by mouth 2 (two) times daily. For muscle and  Joint pain  Dispense: 20 tablet; Refill: 0 -     US  SCROTUM W/DOPPLER; Future -     EKG 12-Lead  Near syncope -     EKG 12-Lead  Panic attack -     hydrOXYzine  Pamoate; Take 1 capsule (25 mg total) by mouth 3 (three) times daily as needed.  Dispense: 30 capsule; Refill: 0  Testicular pain: Stat ultrasound ordered, left lower clinic 75 mg twice a day, takes with food  Panic attack: Start hydroxyzine  25 mg as needed as needed The above assessment and management plan was discussed with the patient. The patient verbalized understanding  of and has agreed to the management plan. Patient is aware to call the clinic if they develop any new symptoms or if symptoms persist or worsen. Patient is aware when to return to the clinic for a follow-up visit. Patient educated on when it is appropriate to go to the emergency department.  Return if symptoms worsen or fail to improve.  Devin Ware [place] Thompson, DNP [facility] [address] [place], [name] [address] [phone]  Note: This document was prepared by [employer] voice dictation technology and any errors that results from this process are unintentional.

## 2024-07-04 ENCOUNTER — Telehealth: Payer: Self-pay

## 2024-07-04 NOTE — Telephone Encounter (Signed)
 Copied from CRM [id number]. Topic: Clinical - Lab/Test Results >> Jul 02, 2024  4:41 PM Adrianna P wrote: Reason for CRM: wanted ultrasound results told him they haven't been seen yet

## 2024-07-05 NOTE — Telephone Encounter (Signed)
 Patient aware.  Patient would like to know what the next step in treatment would be?

## 2024-07-11 NOTE — Telephone Encounter (Signed)
 Abdominal pain follow up scheduled with PCP July 22nd at 4:10pm

## 2024-07-16 ENCOUNTER — Encounter: Payer: Self-pay | Admitting: Family Medicine

## 2024-07-16 ENCOUNTER — Ambulatory Visit: Admitting: Family Medicine

## 2024-07-16 VITALS — BP 130/77 | HR 68 | Temp 98.4°F | Ht 71.0 in | Wt 251.0 lb

## 2024-07-16 DIAGNOSIS — N50812 Left testicular pain: Secondary | ICD-10-CM

## 2024-07-16 NOTE — Progress Notes (Signed)
   Subjective:  Patient ID: Devin Ware, male    DOB: November 07, 1996  Age: 28 y.o. MRN: [id number]  CC: Abdominal Pain (Follow up)   HPI OCTAVIO MATHENEY presents for recent episode of testicular pain radiating to the left lower quadrant.  Symptoms have completely resolved with the use of diclofenac .  Ultrasound was done at the time of presentation.  That showed no sign of testicular lesion or torsion.  Patient is currently pain-free     07/16/2024    4:22 PM 02/16/2022    2:19 PM 01/26/2022    9:32 AM  Depression screen PHQ 2/9  Decreased Interest 0 0 0  Down, Depressed, Hopeless 1 0 0  PHQ - 2 Score 1 0 0  Altered sleeping 0    Tired, decreased energy 1    Change in appetite 0    Feeling bad or failure about yourself  0    Trouble concentrating 1    Moving slowly or fidgety/restless 0    Suicidal thoughts 0    PHQ-9 Score 3      History Timathy has a past medical history of Mass on back (10/2013).   He has a past surgical history that includes No past surgeries and Mass excision (Right, 11/15/2013).   His family history includes CAD in an other family member; CVA in an other family member; Diabetes Mellitus II in an other family member; Hyperlipidemia in his father; Hypertension in his father and mother.He reports that he has never smoked. He has never used smokeless tobacco. He reports that he does not drink alcohol and does not use drugs.    ROS Review of Systems  Objective:  BP 130/77   Pulse 68   Temp 98.4 F (36.9 C)   Ht 5' 11 (1.803 m)   Wt 251 lb (113.9 kg)   SpO2 97%   BMI 35.01 kg/m   BP Readings from Last 3 Encounters:  07/16/24 130/77  07/02/24 130/88  02/16/22 (!) 142/77    Wt Readings from Last 3 Encounters:  07/16/24 251 lb (113.9 kg)  07/02/24 248 lb 3.2 oz (112.6 kg)  02/16/22 258 lb (117 kg)     Physical Exam Genitourinary:    Testes: Normal.        Right: Mass, tenderness, swelling or testicular hydrocele not present.        Left: Mass,  tenderness, swelling or testicular hydrocele not present.      Assessment & Plan:  Testicular pain, left  Symptoms resolved exam back to normal.  Reassured.  Follow-up should symptoms recur.   Follow-up: Return for [facility] physical.  Butler Der, M.D.

## 2024-07-25 ENCOUNTER — Ambulatory Visit: Payer: Self-pay

## 2024-07-25 NOTE — Telephone Encounter (Signed)
 FYI Only or Action Required?: FYI only for provider.  Patient was last seen in primary care on 07/16/2024 by Zollie Lowers, MD.  Called Nurse Triage reporting Jaw Pain.  Symptoms began several days ago.  Interventions attempted: Nothing.  Symptoms are: gradually worsening.  Triage Disposition: See Physician Within 24 Hours  Patient/caregiver understands and will follow disposition?: Yes Reason for Disposition  [1] MODERATE pain (e.g., interferes with normal activities) AND [2] constant AND [3] present > 24 hours  Answer Assessment - Initial Assessment Questions Pt reports 3d of jaw pain, unknown cause. Denies hx of TMJ or sleep apnea. Denies higher acuity questions including cardiac symptoms including chest pain, shortness of breath, shoulder pain, arm pain, or radiation of pain. Denies cardiac hx. No in office availability, pt triaged to UC and pt states he will go there. ED precautions reviewed, pt verbalized understanding.   1. ONSET: When did the pain start? (e.g., minutes, hours, days)     3 days  2. ONSET: Does the pain come and go, or has it been constant since it started? (e.g., constant, intermittent, fleeting)     Constant  3. SEVERITY: How bad is the pain? (Scale 1-10; mild, moderate or severe)     5-6/10  4. LOCATION: Where does it hurt?      Jaw  6. FEVER: Do you have a fever? If Yes, ask: What is it, how was it measured, and when did it start?      Denies  7. OTHER SYMPTOMS: Do you have any other symptoms? (e.g., fever, toothache, nasal discharge, nasal congestion, clicking sensation in jaw joint)     Denies CP, soulder pain, or pain anywhere else. Denies clicking, denies oral pain.  Protocols used: Face Pain-A-AH Copied from CRM [id number]. Topic: Clinical - Red Word Triage >> Jul 25, 2024 11:43 AM Avram MATSU wrote: Red Word that prompted transfer to Nurse Triage: painful jaw

## 2024-07-25 NOTE — Telephone Encounter (Signed)
Sent to  ?

## 2024-08-13 ENCOUNTER — Encounter: Admitting: Family Medicine

## 2024-08-19 ENCOUNTER — Ambulatory Visit: Admitting: Family Medicine

## 2024-08-29 ENCOUNTER — Ambulatory Visit: Admitting: Family Medicine

## 2024-09-18 ENCOUNTER — Encounter: Payer: Self-pay | Admitting: Family Medicine

## 2024-09-18 ENCOUNTER — Encounter: Admitting: Family Medicine
